# Patient Record
Sex: Female | Born: 1935
Health system: Southern US, Community
[De-identification: ages and names within clinical notes are randomized; demographics above are authoritative.]

## PROBLEM LIST (undated history)

## (undated) DIAGNOSIS — M810 Age-related osteoporosis without current pathological fracture: Secondary | ICD-10-CM

## (undated) DIAGNOSIS — Z8719 Personal history of other diseases of the digestive system: Secondary | ICD-10-CM

## (undated) DIAGNOSIS — I8393 Asymptomatic varicose veins of bilateral lower extremities: Secondary | ICD-10-CM

## (undated) DIAGNOSIS — K602 Anal fissure, unspecified: Secondary | ICD-10-CM

## (undated) DIAGNOSIS — N184 Chronic kidney disease, stage 4 (severe): Secondary | ICD-10-CM

## (undated) DIAGNOSIS — K625 Hemorrhage of anus and rectum: Secondary | ICD-10-CM

## (undated) DIAGNOSIS — K624 Stenosis of anus and rectum: Secondary | ICD-10-CM

## (undated) DIAGNOSIS — K219 Gastro-esophageal reflux disease without esophagitis: Secondary | ICD-10-CM

## (undated) DIAGNOSIS — I10 Essential (primary) hypertension: Secondary | ICD-10-CM

## (undated) DIAGNOSIS — K922 Gastrointestinal hemorrhage, unspecified: Secondary | ICD-10-CM

## (undated) DIAGNOSIS — D62 Acute posthemorrhagic anemia: Secondary | ICD-10-CM

## (undated) DIAGNOSIS — H9190 Unspecified hearing loss, unspecified ear: Secondary | ICD-10-CM

## (undated) DIAGNOSIS — F419 Anxiety disorder, unspecified: Secondary | ICD-10-CM

## (undated) HISTORY — PX: HEMORROIDECTOMY: SUR656

## (undated) HISTORY — PX: TONSILLECTOMY: SUR1361

## (undated) HISTORY — PX: COLONOSCOPY: SHX5424

---

## 1997-10-23 ENCOUNTER — Other Ambulatory Visit: Admission: RE | Admit: 1997-10-23 | Discharge: 1997-10-23 | Payer: Self-pay | Admitting: Obstetrics and Gynecology

## 1998-11-04 ENCOUNTER — Other Ambulatory Visit: Admission: RE | Admit: 1998-11-04 | Discharge: 1998-11-04 | Payer: Self-pay | Admitting: Obstetrics and Gynecology

## 2000-08-27 ENCOUNTER — Encounter: Admission: RE | Admit: 2000-08-27 | Discharge: 2000-08-27 | Payer: Self-pay | Admitting: Orthopedic Surgery

## 2000-08-27 ENCOUNTER — Encounter: Payer: Self-pay | Admitting: Orthopedic Surgery

## 2000-09-10 ENCOUNTER — Encounter: Admission: RE | Admit: 2000-09-10 | Discharge: 2000-09-10 | Payer: Self-pay | Admitting: Orthopedic Surgery

## 2000-09-10 ENCOUNTER — Encounter: Payer: Self-pay | Admitting: Orthopedic Surgery

## 2000-09-24 ENCOUNTER — Encounter: Payer: Self-pay | Admitting: Orthopedic Surgery

## 2000-09-24 ENCOUNTER — Encounter: Admission: RE | Admit: 2000-09-24 | Discharge: 2000-09-24 | Payer: Self-pay | Admitting: Orthopedic Surgery

## 2001-04-11 ENCOUNTER — Other Ambulatory Visit: Admission: RE | Admit: 2001-04-11 | Discharge: 2001-04-11 | Payer: Self-pay | Admitting: Obstetrics and Gynecology

## 2009-04-06 ENCOUNTER — Encounter: Admission: RE | Admit: 2009-04-06 | Discharge: 2009-04-06 | Payer: Self-pay | Admitting: Internal Medicine

## 2010-07-16 IMAGING — US US EXTREM LOW VENOUS BILAT
1 series · 13 of 24 positions shown · non-contrast
Comparison: None.

CLINICAL DATA: Bilateral lower extremity swelling.   Evaluate for
venous insufficiency and varicose veins.

BILATERAL LOWER EXTREMITY VENOUS DUPLEX ULTRASOUND
TECHNIQUE: Gray-scale sonography with graded compression, as well
as color Doppler and duplex ultrasound, were performed to evaluate
the deep and superficial veins of both lower extremities.  Spectral
Doppler was utilized to evaluate flow at rest and with distal
augmentation maneuvers.  A complete superficial venous
insufficiency exam was performed in the upright standing position.
I personally performed the technical portion of the exam.

[Series 1: us extrem low venous bilat · 13 of 54 slices shown]
[im 1/54]
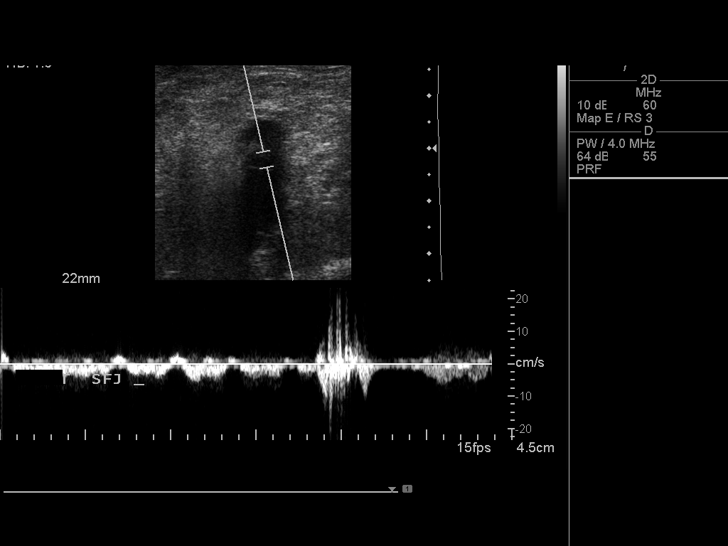
[im 5/54]
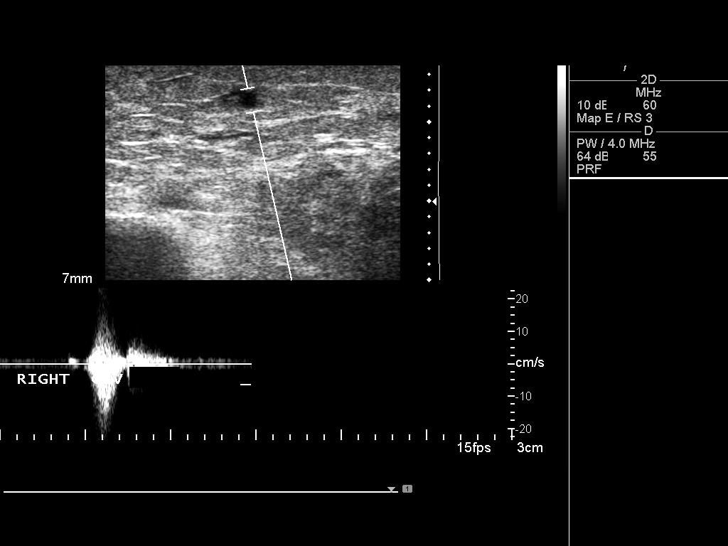
[im 10/54]
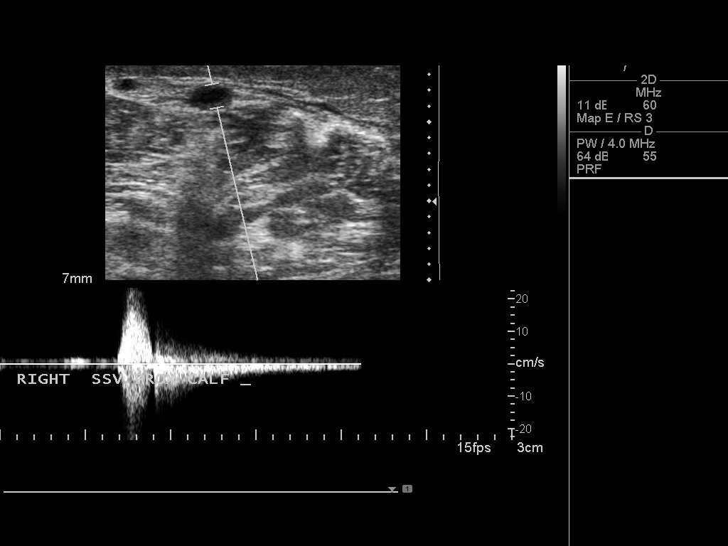
[im 14/54]
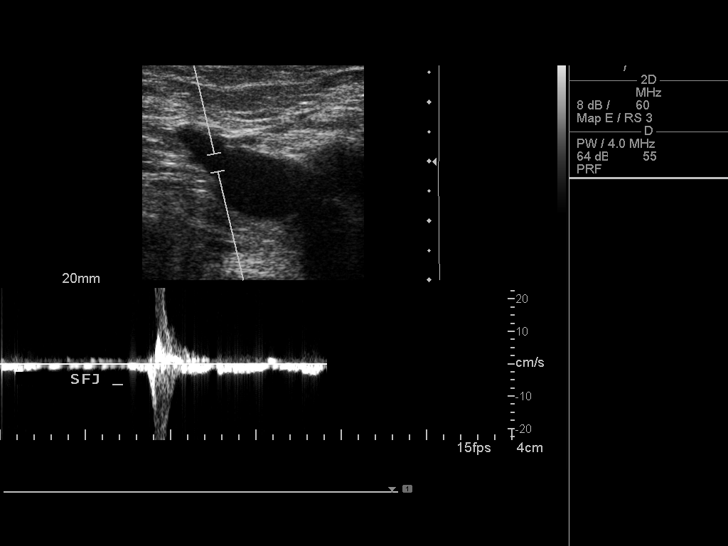
[im 19/54]
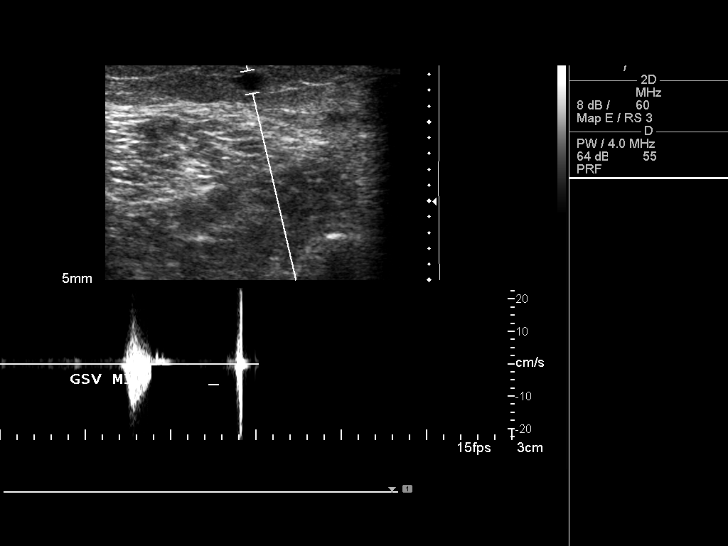
[im 24/54]
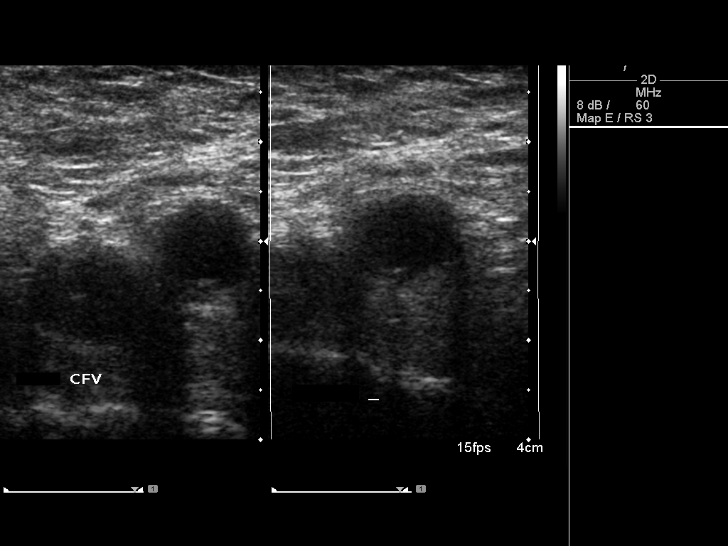
[im 28/54]
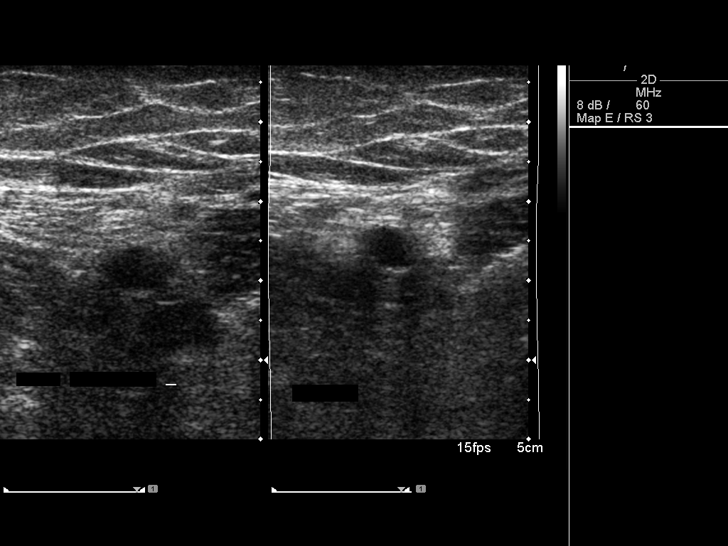
[im 30/54]
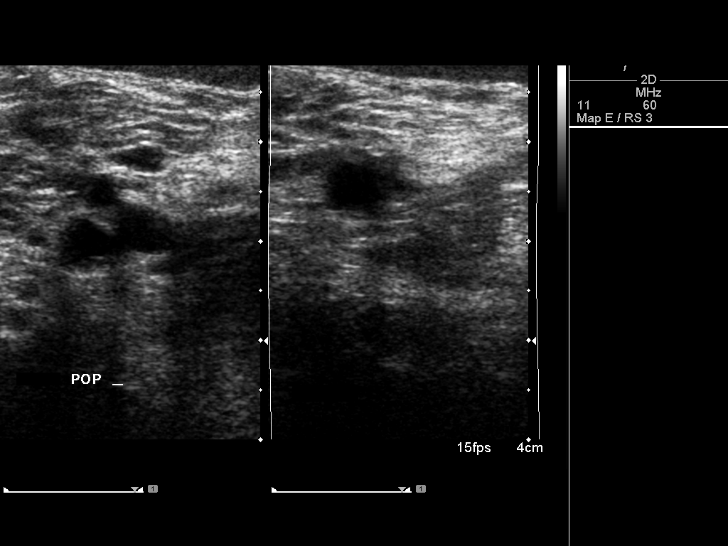
[im 35/54]
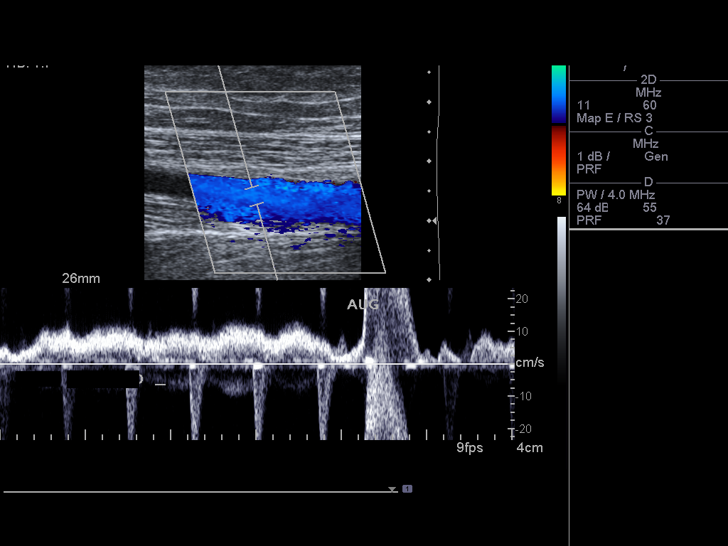
[im 40/54]
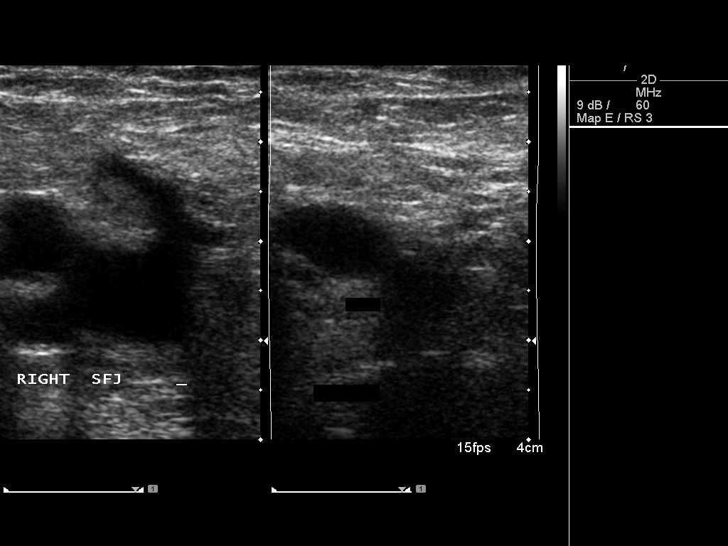
[im 44/54]
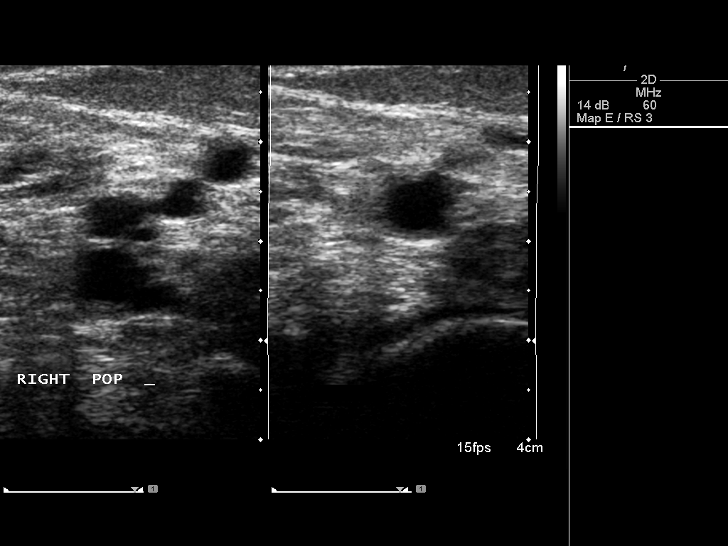
[im 49/54]
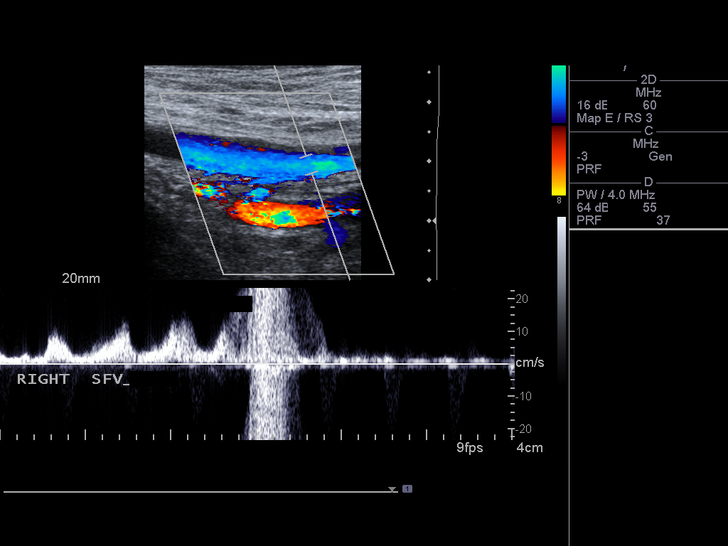
[im 54/54]
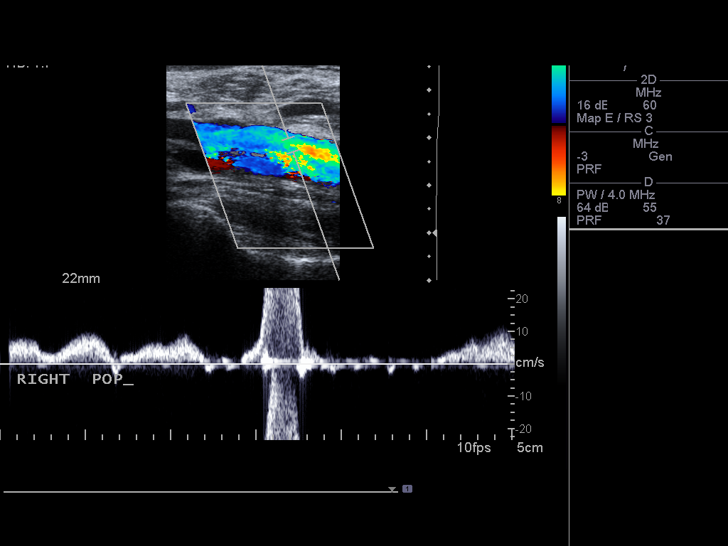

[13 of 24 positions shown; findings below may reference images not displayed]

FINDINGS: Right Lower Extremity:  The right great saphenous vein is small
without evidence of reflux.  A prominent vein coming off the deep
venous system in the right calf does not demonstrate significant
reflux.  The right short saphenous vein comes off the popliteal
vein just above the knee.  This vessel is slightly enlarged and
shows evidence for venous sufficiency and reflux.  There are
probable calcifications involving the distal aspect of the right
short saphenous vein.  Negative for a right lower extremity DVT.
There is subcutaneous edema in the right lower leg.

Left Lower Extremity:  The left great saphenous vein is small
without any significant reflux.  The left short saphenous vein is
small without significant reflux.
IMPRESSION: Negative for bilateral DVT.

Evidence for venous insufficiency and reflux involving the right
short saphenous vein.

No significant reflux involving great saphenous veins.

## 2011-05-12 DIAGNOSIS — J019 Acute sinusitis, unspecified: Secondary | ICD-10-CM | POA: Diagnosis not present

## 2011-09-12 DIAGNOSIS — F411 Generalized anxiety disorder: Secondary | ICD-10-CM | POA: Diagnosis not present

## 2011-09-12 DIAGNOSIS — R5383 Other fatigue: Secondary | ICD-10-CM | POA: Diagnosis not present

## 2011-09-12 DIAGNOSIS — IMO0002 Reserved for concepts with insufficient information to code with codable children: Secondary | ICD-10-CM | POA: Diagnosis not present

## 2011-09-12 DIAGNOSIS — R5381 Other malaise: Secondary | ICD-10-CM | POA: Diagnosis not present

## 2011-09-12 DIAGNOSIS — I10 Essential (primary) hypertension: Secondary | ICD-10-CM | POA: Diagnosis not present

## 2011-10-02 DIAGNOSIS — B309 Viral conjunctivitis, unspecified: Secondary | ICD-10-CM | POA: Diagnosis not present

## 2011-10-24 DIAGNOSIS — H00029 Hordeolum internum unspecified eye, unspecified eyelid: Secondary | ICD-10-CM | POA: Diagnosis not present

## 2011-10-24 DIAGNOSIS — H1045 Other chronic allergic conjunctivitis: Secondary | ICD-10-CM | POA: Diagnosis not present

## 2011-10-24 DIAGNOSIS — H04129 Dry eye syndrome of unspecified lacrimal gland: Secondary | ICD-10-CM | POA: Diagnosis not present

## 2011-11-16 DIAGNOSIS — L259 Unspecified contact dermatitis, unspecified cause: Secondary | ICD-10-CM | POA: Diagnosis not present

## 2011-11-16 DIAGNOSIS — C44319 Basal cell carcinoma of skin of other parts of face: Secondary | ICD-10-CM | POA: Diagnosis not present

## 2011-11-16 DIAGNOSIS — L738 Other specified follicular disorders: Secondary | ICD-10-CM | POA: Diagnosis not present

## 2011-11-16 DIAGNOSIS — L570 Actinic keratosis: Secondary | ICD-10-CM | POA: Diagnosis not present

## 2011-11-28 DIAGNOSIS — H00029 Hordeolum internum unspecified eye, unspecified eyelid: Secondary | ICD-10-CM | POA: Diagnosis not present

## 2011-11-28 DIAGNOSIS — H04129 Dry eye syndrome of unspecified lacrimal gland: Secondary | ICD-10-CM | POA: Diagnosis not present

## 2011-12-13 DIAGNOSIS — B309 Viral conjunctivitis, unspecified: Secondary | ICD-10-CM | POA: Diagnosis not present

## 2011-12-29 DIAGNOSIS — H9319 Tinnitus, unspecified ear: Secondary | ICD-10-CM | POA: Diagnosis not present

## 2011-12-29 DIAGNOSIS — H9190 Unspecified hearing loss, unspecified ear: Secondary | ICD-10-CM | POA: Diagnosis not present

## 2012-01-09 DIAGNOSIS — IMO0002 Reserved for concepts with insufficient information to code with codable children: Secondary | ICD-10-CM | POA: Diagnosis not present

## 2012-01-09 DIAGNOSIS — I10 Essential (primary) hypertension: Secondary | ICD-10-CM | POA: Diagnosis not present

## 2012-01-09 DIAGNOSIS — Z23 Encounter for immunization: Secondary | ICD-10-CM | POA: Diagnosis not present

## 2012-02-05 DIAGNOSIS — H4310 Vitreous hemorrhage, unspecified eye: Secondary | ICD-10-CM | POA: Diagnosis not present

## 2012-02-15 DIAGNOSIS — H1130 Conjunctival hemorrhage, unspecified eye: Secondary | ICD-10-CM | POA: Diagnosis not present

## 2012-06-19 DIAGNOSIS — I10 Essential (primary) hypertension: Secondary | ICD-10-CM | POA: Diagnosis not present

## 2012-06-19 DIAGNOSIS — R229 Localized swelling, mass and lump, unspecified: Secondary | ICD-10-CM | POA: Diagnosis not present

## 2012-06-21 DIAGNOSIS — R229 Localized swelling, mass and lump, unspecified: Secondary | ICD-10-CM | POA: Diagnosis not present

## 2012-06-21 DIAGNOSIS — Z1231 Encounter for screening mammogram for malignant neoplasm of breast: Secondary | ICD-10-CM | POA: Diagnosis not present

## 2012-10-28 DIAGNOSIS — S058X9A Other injuries of unspecified eye and orbit, initial encounter: Secondary | ICD-10-CM | POA: Diagnosis not present

## 2012-11-21 DIAGNOSIS — I10 Essential (primary) hypertension: Secondary | ICD-10-CM | POA: Diagnosis not present

## 2012-11-21 DIAGNOSIS — N390 Urinary tract infection, site not specified: Secondary | ICD-10-CM | POA: Diagnosis not present

## 2012-11-21 DIAGNOSIS — R141 Gas pain: Secondary | ICD-10-CM | POA: Diagnosis not present

## 2012-12-31 DIAGNOSIS — Z23 Encounter for immunization: Secondary | ICD-10-CM | POA: Diagnosis not present

## 2012-12-31 DIAGNOSIS — IMO0002 Reserved for concepts with insufficient information to code with codable children: Secondary | ICD-10-CM | POA: Diagnosis not present

## 2012-12-31 DIAGNOSIS — I10 Essential (primary) hypertension: Secondary | ICD-10-CM | POA: Diagnosis not present

## 2013-05-01 DIAGNOSIS — IMO0002 Reserved for concepts with insufficient information to code with codable children: Secondary | ICD-10-CM | POA: Diagnosis not present

## 2013-05-01 DIAGNOSIS — S81009A Unspecified open wound, unspecified knee, initial encounter: Secondary | ICD-10-CM | POA: Diagnosis not present

## 2013-05-01 DIAGNOSIS — Z23 Encounter for immunization: Secondary | ICD-10-CM | POA: Diagnosis not present

## 2013-05-14 DIAGNOSIS — IMO0002 Reserved for concepts with insufficient information to code with codable children: Secondary | ICD-10-CM | POA: Diagnosis not present

## 2013-05-14 DIAGNOSIS — L02419 Cutaneous abscess of limb, unspecified: Secondary | ICD-10-CM | POA: Diagnosis not present

## 2013-05-14 DIAGNOSIS — L03119 Cellulitis of unspecified part of limb: Secondary | ICD-10-CM | POA: Diagnosis not present

## 2013-07-08 DIAGNOSIS — M810 Age-related osteoporosis without current pathological fracture: Secondary | ICD-10-CM | POA: Diagnosis not present

## 2013-07-08 DIAGNOSIS — IMO0002 Reserved for concepts with insufficient information to code with codable children: Secondary | ICD-10-CM | POA: Diagnosis not present

## 2013-07-08 DIAGNOSIS — I10 Essential (primary) hypertension: Secondary | ICD-10-CM | POA: Diagnosis not present

## 2013-07-08 DIAGNOSIS — F411 Generalized anxiety disorder: Secondary | ICD-10-CM | POA: Diagnosis not present

## 2013-09-04 DIAGNOSIS — J019 Acute sinusitis, unspecified: Secondary | ICD-10-CM | POA: Diagnosis not present

## 2013-09-04 DIAGNOSIS — IMO0002 Reserved for concepts with insufficient information to code with codable children: Secondary | ICD-10-CM | POA: Diagnosis not present

## 2013-12-22 DIAGNOSIS — J069 Acute upper respiratory infection, unspecified: Secondary | ICD-10-CM | POA: Diagnosis not present

## 2013-12-22 DIAGNOSIS — IMO0002 Reserved for concepts with insufficient information to code with codable children: Secondary | ICD-10-CM | POA: Diagnosis not present

## 2014-01-12 DIAGNOSIS — J329 Chronic sinusitis, unspecified: Secondary | ICD-10-CM | POA: Diagnosis not present

## 2014-01-12 DIAGNOSIS — F419 Anxiety disorder, unspecified: Secondary | ICD-10-CM | POA: Diagnosis not present

## 2014-01-12 DIAGNOSIS — M810 Age-related osteoporosis without current pathological fracture: Secondary | ICD-10-CM | POA: Diagnosis not present

## 2014-01-12 DIAGNOSIS — I10 Essential (primary) hypertension: Secondary | ICD-10-CM | POA: Diagnosis not present

## 2014-01-12 DIAGNOSIS — Z6821 Body mass index (BMI) 21.0-21.9, adult: Secondary | ICD-10-CM | POA: Diagnosis not present

## 2014-01-12 DIAGNOSIS — Z23 Encounter for immunization: Secondary | ICD-10-CM | POA: Diagnosis not present

## 2014-07-16 DIAGNOSIS — Z6821 Body mass index (BMI) 21.0-21.9, adult: Secondary | ICD-10-CM | POA: Diagnosis not present

## 2014-07-16 DIAGNOSIS — Z9181 History of falling: Secondary | ICD-10-CM | POA: Diagnosis not present

## 2014-07-16 DIAGNOSIS — F419 Anxiety disorder, unspecified: Secondary | ICD-10-CM | POA: Diagnosis not present

## 2014-07-16 DIAGNOSIS — Z1389 Encounter for screening for other disorder: Secondary | ICD-10-CM | POA: Diagnosis not present

## 2014-07-16 DIAGNOSIS — I10 Essential (primary) hypertension: Secondary | ICD-10-CM | POA: Diagnosis not present

## 2014-07-16 DIAGNOSIS — M810 Age-related osteoporosis without current pathological fracture: Secondary | ICD-10-CM | POA: Diagnosis not present

## 2014-09-18 DIAGNOSIS — B370 Candidal stomatitis: Secondary | ICD-10-CM | POA: Diagnosis not present

## 2014-09-18 DIAGNOSIS — Z6821 Body mass index (BMI) 21.0-21.9, adult: Secondary | ICD-10-CM | POA: Diagnosis not present

## 2014-11-26 DIAGNOSIS — I10 Essential (primary) hypertension: Secondary | ICD-10-CM | POA: Diagnosis not present

## 2014-11-26 DIAGNOSIS — Z23 Encounter for immunization: Secondary | ICD-10-CM | POA: Diagnosis not present

## 2014-11-26 DIAGNOSIS — Z6821 Body mass index (BMI) 21.0-21.9, adult: Secondary | ICD-10-CM | POA: Diagnosis not present

## 2014-12-28 DIAGNOSIS — J019 Acute sinusitis, unspecified: Secondary | ICD-10-CM | POA: Diagnosis not present

## 2014-12-28 DIAGNOSIS — Z6821 Body mass index (BMI) 21.0-21.9, adult: Secondary | ICD-10-CM | POA: Diagnosis not present

## 2015-01-04 DIAGNOSIS — M533 Sacrococcygeal disorders, not elsewhere classified: Secondary | ICD-10-CM | POA: Diagnosis not present

## 2015-01-04 DIAGNOSIS — R918 Other nonspecific abnormal finding of lung field: Secondary | ICD-10-CM | POA: Diagnosis present

## 2015-01-04 DIAGNOSIS — S82832A Other fracture of upper and lower end of left fibula, initial encounter for closed fracture: Secondary | ICD-10-CM | POA: Diagnosis present

## 2015-01-04 DIAGNOSIS — M25572 Pain in left ankle and joints of left foot: Secondary | ICD-10-CM | POA: Diagnosis not present

## 2015-01-04 DIAGNOSIS — M549 Dorsalgia, unspecified: Secondary | ICD-10-CM | POA: Diagnosis not present

## 2015-01-04 DIAGNOSIS — S3992XA Unspecified injury of lower back, initial encounter: Secondary | ICD-10-CM | POA: Diagnosis not present

## 2015-01-04 DIAGNOSIS — Z7189 Other specified counseling: Secondary | ICD-10-CM | POA: Diagnosis not present

## 2015-01-04 DIAGNOSIS — S299XXA Unspecified injury of thorax, initial encounter: Secondary | ICD-10-CM | POA: Diagnosis not present

## 2015-01-04 DIAGNOSIS — J90 Pleural effusion, not elsewhere classified: Secondary | ICD-10-CM | POA: Diagnosis not present

## 2015-01-04 DIAGNOSIS — S22000A Wedge compression fracture of unspecified thoracic vertebra, initial encounter for closed fracture: Secondary | ICD-10-CM | POA: Diagnosis not present

## 2015-01-04 DIAGNOSIS — S89292A Other physeal fracture of upper end of left fibula, initial encounter for closed fracture: Secondary | ICD-10-CM | POA: Diagnosis not present

## 2015-01-04 DIAGNOSIS — S32132A Severely displaced Zone III fracture of sacrum, initial encounter for closed fracture: Secondary | ICD-10-CM | POA: Diagnosis not present

## 2015-01-04 DIAGNOSIS — S82432A Displaced oblique fracture of shaft of left fibula, initial encounter for closed fracture: Secondary | ICD-10-CM | POA: Diagnosis not present

## 2015-01-04 DIAGNOSIS — S82402D Unspecified fracture of shaft of left fibula, subsequent encounter for closed fracture with routine healing: Secondary | ICD-10-CM | POA: Diagnosis not present

## 2015-01-04 DIAGNOSIS — S32059A Unspecified fracture of fifth lumbar vertebra, initial encounter for closed fracture: Secondary | ICD-10-CM | POA: Diagnosis not present

## 2015-01-04 DIAGNOSIS — S22080A Wedge compression fracture of T11-T12 vertebra, initial encounter for closed fracture: Secondary | ICD-10-CM | POA: Diagnosis not present

## 2015-01-04 DIAGNOSIS — R54 Age-related physical debility: Secondary | ICD-10-CM | POA: Diagnosis not present

## 2015-01-04 DIAGNOSIS — S82392A Other fracture of lower end of left tibia, initial encounter for closed fracture: Secondary | ICD-10-CM | POA: Diagnosis not present

## 2015-01-04 DIAGNOSIS — S82402A Unspecified fracture of shaft of left fibula, initial encounter for closed fracture: Secondary | ICD-10-CM | POA: Diagnosis not present

## 2015-01-04 DIAGNOSIS — S3210XA Unspecified fracture of sacrum, initial encounter for closed fracture: Secondary | ICD-10-CM | POA: Diagnosis present

## 2015-01-04 DIAGNOSIS — M25462 Effusion, left knee: Secondary | ICD-10-CM | POA: Diagnosis present

## 2015-01-04 DIAGNOSIS — R410 Disorientation, unspecified: Secondary | ICD-10-CM | POA: Diagnosis not present

## 2015-01-04 DIAGNOSIS — R000 Tachycardia, unspecified: Secondary | ICD-10-CM | POA: Diagnosis not present

## 2015-01-04 DIAGNOSIS — R278 Other lack of coordination: Secondary | ICD-10-CM | POA: Diagnosis not present

## 2015-01-04 DIAGNOSIS — S3210XD Unspecified fracture of sacrum, subsequent encounter for fracture with routine healing: Secondary | ICD-10-CM | POA: Diagnosis not present

## 2015-01-04 DIAGNOSIS — K449 Diaphragmatic hernia without obstruction or gangrene: Secondary | ICD-10-CM | POA: Diagnosis not present

## 2015-01-04 DIAGNOSIS — K219 Gastro-esophageal reflux disease without esophagitis: Secondary | ICD-10-CM | POA: Diagnosis present

## 2015-01-04 DIAGNOSIS — S22089D Unspecified fracture of T11-T12 vertebra, subsequent encounter for fracture with routine healing: Secondary | ICD-10-CM | POA: Diagnosis not present

## 2015-01-04 DIAGNOSIS — D62 Acute posthemorrhagic anemia: Secondary | ICD-10-CM | POA: Diagnosis present

## 2015-01-04 DIAGNOSIS — G4700 Insomnia, unspecified: Secondary | ICD-10-CM | POA: Diagnosis not present

## 2015-01-04 DIAGNOSIS — S82202A Unspecified fracture of shaft of left tibia, initial encounter for closed fracture: Secondary | ICD-10-CM | POA: Diagnosis not present

## 2015-01-04 DIAGNOSIS — S32050A Wedge compression fracture of fifth lumbar vertebra, initial encounter for closed fracture: Secondary | ICD-10-CM | POA: Diagnosis not present

## 2015-01-04 DIAGNOSIS — R1319 Other dysphagia: Secondary | ICD-10-CM | POA: Diagnosis not present

## 2015-01-04 DIAGNOSIS — K5900 Constipation, unspecified: Secondary | ICD-10-CM | POA: Diagnosis not present

## 2015-01-04 DIAGNOSIS — E119 Type 2 diabetes mellitus without complications: Secondary | ICD-10-CM | POA: Diagnosis present

## 2015-01-04 DIAGNOSIS — R911 Solitary pulmonary nodule: Secondary | ICD-10-CM | POA: Diagnosis not present

## 2015-01-04 DIAGNOSIS — J984 Other disorders of lung: Secondary | ICD-10-CM | POA: Diagnosis not present

## 2015-01-04 DIAGNOSIS — D649 Anemia, unspecified: Secondary | ICD-10-CM | POA: Diagnosis not present

## 2015-01-04 DIAGNOSIS — S0990XA Unspecified injury of head, initial encounter: Secondary | ICD-10-CM | POA: Diagnosis not present

## 2015-01-04 DIAGNOSIS — Z7409 Other reduced mobility: Secondary | ICD-10-CM | POA: Diagnosis not present

## 2015-01-04 DIAGNOSIS — M25562 Pain in left knee: Secondary | ICD-10-CM | POA: Diagnosis not present

## 2015-01-04 DIAGNOSIS — I10 Essential (primary) hypertension: Secondary | ICD-10-CM | POA: Diagnosis present

## 2015-01-04 DIAGNOSIS — Z79899 Other long term (current) drug therapy: Secondary | ICD-10-CM | POA: Diagnosis not present

## 2015-01-04 DIAGNOSIS — S82202D Unspecified fracture of shaft of left tibia, subsequent encounter for closed fracture with routine healing: Secondary | ICD-10-CM | POA: Diagnosis not present

## 2015-01-04 DIAGNOSIS — M4848XA Fatigue fracture of vertebra, sacral and sacrococcygeal region, initial encounter for fracture: Secondary | ICD-10-CM | POA: Diagnosis not present

## 2015-01-04 DIAGNOSIS — T148 Other injury of unspecified body region: Secondary | ICD-10-CM | POA: Diagnosis not present

## 2015-01-04 DIAGNOSIS — T07 Unspecified multiple injuries: Secondary | ICD-10-CM | POA: Diagnosis not present

## 2015-01-04 DIAGNOSIS — M6281 Muscle weakness (generalized): Secondary | ICD-10-CM | POA: Diagnosis not present

## 2015-01-04 DIAGNOSIS — R0689 Other abnormalities of breathing: Secondary | ICD-10-CM | POA: Diagnosis present

## 2015-01-04 DIAGNOSIS — S22088A Other fracture of T11-T12 vertebra, initial encounter for closed fracture: Secondary | ICD-10-CM | POA: Diagnosis not present

## 2015-01-04 DIAGNOSIS — S32000A Wedge compression fracture of unspecified lumbar vertebra, initial encounter for closed fracture: Secondary | ICD-10-CM | POA: Diagnosis not present

## 2015-01-04 DIAGNOSIS — M1990 Unspecified osteoarthritis, unspecified site: Secondary | ICD-10-CM | POA: Diagnosis present

## 2015-01-04 DIAGNOSIS — S199XXA Unspecified injury of neck, initial encounter: Secondary | ICD-10-CM | POA: Diagnosis not present

## 2015-01-04 DIAGNOSIS — S32059D Unspecified fracture of fifth lumbar vertebra, subsequent encounter for fracture with routine healing: Secondary | ICD-10-CM | POA: Diagnosis not present

## 2015-01-04 DIAGNOSIS — T1490 Injury, unspecified: Secondary | ICD-10-CM | POA: Diagnosis not present

## 2015-01-04 DIAGNOSIS — S82142A Displaced bicondylar fracture of left tibia, initial encounter for closed fracture: Secondary | ICD-10-CM | POA: Diagnosis not present

## 2015-01-04 DIAGNOSIS — S89092A Other physeal fracture of upper end of left tibia, initial encounter for closed fracture: Secondary | ICD-10-CM | POA: Diagnosis not present

## 2015-01-04 DIAGNOSIS — S32058A Other fracture of fifth lumbar vertebra, initial encounter for closed fracture: Secondary | ICD-10-CM | POA: Diagnosis not present

## 2015-01-04 DIAGNOSIS — S82252A Displaced comminuted fracture of shaft of left tibia, initial encounter for closed fracture: Secondary | ICD-10-CM | POA: Diagnosis not present

## 2015-01-04 DIAGNOSIS — S22089S Unspecified fracture of T11-T12 vertebra, sequela: Secondary | ICD-10-CM | POA: Diagnosis not present

## 2015-01-04 DIAGNOSIS — J9811 Atelectasis: Secondary | ICD-10-CM | POA: Diagnosis not present

## 2015-01-04 DIAGNOSIS — S22081A Stable burst fracture of T11-T12 vertebra, initial encounter for closed fracture: Secondary | ICD-10-CM | POA: Diagnosis present

## 2015-01-04 DIAGNOSIS — S3219XA Other fracture of sacrum, initial encounter for closed fracture: Secondary | ICD-10-CM | POA: Diagnosis not present

## 2015-01-04 DIAGNOSIS — Z515 Encounter for palliative care: Secondary | ICD-10-CM | POA: Diagnosis present

## 2015-01-11 DIAGNOSIS — M439 Deforming dorsopathy, unspecified: Secondary | ICD-10-CM | POA: Diagnosis not present

## 2015-01-11 DIAGNOSIS — S82832D Other fracture of upper and lower end of left fibula, subsequent encounter for closed fracture with routine healing: Secondary | ICD-10-CM | POA: Diagnosis not present

## 2015-01-11 DIAGNOSIS — R918 Other nonspecific abnormal finding of lung field: Secondary | ICD-10-CM | POA: Diagnosis not present

## 2015-01-11 DIAGNOSIS — S32058A Other fracture of fifth lumbar vertebra, initial encounter for closed fracture: Secondary | ICD-10-CM | POA: Diagnosis not present

## 2015-01-11 DIAGNOSIS — M25462 Effusion, left knee: Secondary | ICD-10-CM | POA: Diagnosis not present

## 2015-01-11 DIAGNOSIS — S329XXA Fracture of unspecified parts of lumbosacral spine and pelvis, initial encounter for closed fracture: Secondary | ICD-10-CM | POA: Diagnosis not present

## 2015-01-11 DIAGNOSIS — Z7982 Long term (current) use of aspirin: Secondary | ICD-10-CM | POA: Diagnosis not present

## 2015-01-11 DIAGNOSIS — M47814 Spondylosis without myelopathy or radiculopathy, thoracic region: Secondary | ICD-10-CM | POA: Diagnosis not present

## 2015-01-11 DIAGNOSIS — R911 Solitary pulmonary nodule: Secondary | ICD-10-CM | POA: Diagnosis not present

## 2015-01-11 DIAGNOSIS — G4700 Insomnia, unspecified: Secondary | ICD-10-CM | POA: Diagnosis not present

## 2015-01-11 DIAGNOSIS — M6281 Muscle weakness (generalized): Secondary | ICD-10-CM | POA: Diagnosis not present

## 2015-01-11 DIAGNOSIS — S22080D Wedge compression fracture of T11-T12 vertebra, subsequent encounter for fracture with routine healing: Secondary | ICD-10-CM | POA: Diagnosis not present

## 2015-01-11 DIAGNOSIS — S82002K Unspecified fracture of left patella, subsequent encounter for closed fracture with nonunion: Secondary | ICD-10-CM | POA: Diagnosis not present

## 2015-01-11 DIAGNOSIS — J90 Pleural effusion, not elsewhere classified: Secondary | ICD-10-CM | POA: Diagnosis not present

## 2015-01-11 DIAGNOSIS — T148 Other injury of unspecified body region: Secondary | ICD-10-CM | POA: Diagnosis not present

## 2015-01-11 DIAGNOSIS — S82142D Displaced bicondylar fracture of left tibia, subsequent encounter for closed fracture with routine healing: Secondary | ICD-10-CM | POA: Diagnosis not present

## 2015-01-11 DIAGNOSIS — S22088D Other fracture of T11-T12 vertebra, subsequent encounter for fracture with routine healing: Secondary | ICD-10-CM | POA: Diagnosis not present

## 2015-01-11 DIAGNOSIS — S22088A Other fracture of T11-T12 vertebra, initial encounter for closed fracture: Secondary | ICD-10-CM | POA: Diagnosis not present

## 2015-01-11 DIAGNOSIS — S82142A Displaced bicondylar fracture of left tibia, initial encounter for closed fracture: Secondary | ICD-10-CM | POA: Diagnosis not present

## 2015-01-11 DIAGNOSIS — M8580 Other specified disorders of bone density and structure, unspecified site: Secondary | ICD-10-CM | POA: Diagnosis not present

## 2015-01-11 DIAGNOSIS — Z7409 Other reduced mobility: Secondary | ICD-10-CM | POA: Diagnosis not present

## 2015-01-11 DIAGNOSIS — M1990 Unspecified osteoarthritis, unspecified site: Secondary | ICD-10-CM | POA: Diagnosis not present

## 2015-01-11 DIAGNOSIS — M25579 Pain in unspecified ankle and joints of unspecified foot: Secondary | ICD-10-CM | POA: Diagnosis not present

## 2015-01-11 DIAGNOSIS — S22089D Unspecified fracture of T11-T12 vertebra, subsequent encounter for fracture with routine healing: Secondary | ICD-10-CM | POA: Diagnosis not present

## 2015-01-11 DIAGNOSIS — K219 Gastro-esophageal reflux disease without esophagitis: Secondary | ICD-10-CM | POA: Diagnosis not present

## 2015-01-11 DIAGNOSIS — S32009K Unspecified fracture of unspecified lumbar vertebra, subsequent encounter for fracture with nonunion: Secondary | ICD-10-CM | POA: Diagnosis not present

## 2015-01-11 DIAGNOSIS — H10019 Acute follicular conjunctivitis, unspecified eye: Secondary | ICD-10-CM | POA: Diagnosis not present

## 2015-01-11 DIAGNOSIS — Z4789 Encounter for other orthopedic aftercare: Secondary | ICD-10-CM | POA: Diagnosis not present

## 2015-01-11 DIAGNOSIS — S82432A Displaced oblique fracture of shaft of left fibula, initial encounter for closed fracture: Secondary | ICD-10-CM | POA: Diagnosis not present

## 2015-01-11 DIAGNOSIS — S3210XD Unspecified fracture of sacrum, subsequent encounter for fracture with routine healing: Secondary | ICD-10-CM | POA: Diagnosis not present

## 2015-01-11 DIAGNOSIS — I10 Essential (primary) hypertension: Secondary | ICD-10-CM | POA: Diagnosis not present

## 2015-01-11 DIAGNOSIS — M25562 Pain in left knee: Secondary | ICD-10-CM | POA: Diagnosis not present

## 2015-01-11 DIAGNOSIS — S82832A Other fracture of upper and lower end of left fibula, initial encounter for closed fracture: Secondary | ICD-10-CM | POA: Diagnosis not present

## 2015-01-11 DIAGNOSIS — R1319 Other dysphagia: Secondary | ICD-10-CM | POA: Diagnosis not present

## 2015-01-11 DIAGNOSIS — S22058D Other fracture of T5-T6 vertebra, subsequent encounter for fracture with routine healing: Secondary | ICD-10-CM | POA: Diagnosis not present

## 2015-01-11 DIAGNOSIS — R410 Disorientation, unspecified: Secondary | ICD-10-CM | POA: Diagnosis not present

## 2015-01-11 DIAGNOSIS — K5900 Constipation, unspecified: Secondary | ICD-10-CM | POA: Diagnosis not present

## 2015-01-11 DIAGNOSIS — S82202D Unspecified fracture of shaft of left tibia, subsequent encounter for closed fracture with routine healing: Secondary | ICD-10-CM | POA: Diagnosis not present

## 2015-01-11 DIAGNOSIS — M21262 Flexion deformity, left knee: Secondary | ICD-10-CM | POA: Diagnosis not present

## 2015-01-11 DIAGNOSIS — Z79899 Other long term (current) drug therapy: Secondary | ICD-10-CM | POA: Diagnosis not present

## 2015-01-11 DIAGNOSIS — S22050D Wedge compression fracture of T5-T6 vertebra, subsequent encounter for fracture with routine healing: Secondary | ICD-10-CM | POA: Diagnosis not present

## 2015-01-11 DIAGNOSIS — Z23 Encounter for immunization: Secondary | ICD-10-CM | POA: Diagnosis not present

## 2015-01-11 DIAGNOSIS — S82252A Displaced comminuted fracture of shaft of left tibia, initial encounter for closed fracture: Secondary | ICD-10-CM | POA: Diagnosis not present

## 2015-01-11 DIAGNOSIS — D649 Anemia, unspecified: Secondary | ICD-10-CM | POA: Diagnosis not present

## 2015-01-11 DIAGNOSIS — S82402D Unspecified fracture of shaft of left fibula, subsequent encounter for closed fracture with routine healing: Secondary | ICD-10-CM | POA: Diagnosis not present

## 2015-01-11 DIAGNOSIS — I7090 Unspecified atherosclerosis: Secondary | ICD-10-CM | POA: Diagnosis not present

## 2015-01-11 DIAGNOSIS — S32059D Unspecified fracture of fifth lumbar vertebra, subsequent encounter for fracture with routine healing: Secondary | ICD-10-CM | POA: Diagnosis not present

## 2015-01-11 DIAGNOSIS — R278 Other lack of coordination: Secondary | ICD-10-CM | POA: Diagnosis not present

## 2015-01-13 DIAGNOSIS — S32009K Unspecified fracture of unspecified lumbar vertebra, subsequent encounter for fracture with nonunion: Secondary | ICD-10-CM | POA: Diagnosis not present

## 2015-01-13 DIAGNOSIS — R911 Solitary pulmonary nodule: Secondary | ICD-10-CM | POA: Diagnosis not present

## 2015-01-13 DIAGNOSIS — K219 Gastro-esophageal reflux disease without esophagitis: Secondary | ICD-10-CM | POA: Diagnosis not present

## 2015-01-13 DIAGNOSIS — D649 Anemia, unspecified: Secondary | ICD-10-CM | POA: Diagnosis not present

## 2015-01-13 DIAGNOSIS — S82002K Unspecified fracture of left patella, subsequent encounter for closed fracture with nonunion: Secondary | ICD-10-CM | POA: Diagnosis not present

## 2015-01-13 DIAGNOSIS — I10 Essential (primary) hypertension: Secondary | ICD-10-CM | POA: Diagnosis not present

## 2015-01-14 DIAGNOSIS — Z23 Encounter for immunization: Secondary | ICD-10-CM | POA: Diagnosis not present

## 2015-01-15 DIAGNOSIS — I10 Essential (primary) hypertension: Secondary | ICD-10-CM | POA: Diagnosis not present

## 2015-01-15 DIAGNOSIS — H10019 Acute follicular conjunctivitis, unspecified eye: Secondary | ICD-10-CM | POA: Diagnosis not present

## 2015-01-15 DIAGNOSIS — S82002K Unspecified fracture of left patella, subsequent encounter for closed fracture with nonunion: Secondary | ICD-10-CM | POA: Diagnosis not present

## 2015-01-15 DIAGNOSIS — K219 Gastro-esophageal reflux disease without esophagitis: Secondary | ICD-10-CM | POA: Diagnosis not present

## 2015-01-15 DIAGNOSIS — D649 Anemia, unspecified: Secondary | ICD-10-CM | POA: Diagnosis not present

## 2015-01-15 DIAGNOSIS — S32009K Unspecified fracture of unspecified lumbar vertebra, subsequent encounter for fracture with nonunion: Secondary | ICD-10-CM | POA: Diagnosis not present

## 2015-01-15 DIAGNOSIS — R911 Solitary pulmonary nodule: Secondary | ICD-10-CM | POA: Diagnosis not present

## 2015-01-26 DIAGNOSIS — S82402D Unspecified fracture of shaft of left fibula, subsequent encounter for closed fracture with routine healing: Secondary | ICD-10-CM | POA: Diagnosis not present

## 2015-01-26 DIAGNOSIS — S82202D Unspecified fracture of shaft of left tibia, subsequent encounter for closed fracture with routine healing: Secondary | ICD-10-CM | POA: Diagnosis not present

## 2015-01-26 DIAGNOSIS — S82142A Displaced bicondylar fracture of left tibia, initial encounter for closed fracture: Secondary | ICD-10-CM | POA: Diagnosis not present

## 2015-01-26 DIAGNOSIS — S82832A Other fracture of upper and lower end of left fibula, initial encounter for closed fracture: Secondary | ICD-10-CM | POA: Diagnosis not present

## 2015-02-03 DIAGNOSIS — S22050D Wedge compression fracture of T5-T6 vertebra, subsequent encounter for fracture with routine healing: Secondary | ICD-10-CM | POA: Diagnosis not present

## 2015-02-03 DIAGNOSIS — M8580 Other specified disorders of bone density and structure, unspecified site: Secondary | ICD-10-CM | POA: Diagnosis not present

## 2015-02-03 DIAGNOSIS — I10 Essential (primary) hypertension: Secondary | ICD-10-CM | POA: Diagnosis not present

## 2015-02-03 DIAGNOSIS — S22080D Wedge compression fracture of T11-T12 vertebra, subsequent encounter for fracture with routine healing: Secondary | ICD-10-CM | POA: Diagnosis not present

## 2015-02-03 DIAGNOSIS — M47814 Spondylosis without myelopathy or radiculopathy, thoracic region: Secondary | ICD-10-CM | POA: Diagnosis not present

## 2015-02-03 DIAGNOSIS — M439 Deforming dorsopathy, unspecified: Secondary | ICD-10-CM | POA: Diagnosis not present

## 2015-02-10 DIAGNOSIS — S82002K Unspecified fracture of left patella, subsequent encounter for closed fracture with nonunion: Secondary | ICD-10-CM | POA: Diagnosis not present

## 2015-02-10 DIAGNOSIS — K5900 Constipation, unspecified: Secondary | ICD-10-CM | POA: Diagnosis not present

## 2015-02-10 DIAGNOSIS — I10 Essential (primary) hypertension: Secondary | ICD-10-CM | POA: Diagnosis not present

## 2015-02-10 DIAGNOSIS — D649 Anemia, unspecified: Secondary | ICD-10-CM | POA: Diagnosis not present

## 2015-02-10 DIAGNOSIS — S32009K Unspecified fracture of unspecified lumbar vertebra, subsequent encounter for fracture with nonunion: Secondary | ICD-10-CM | POA: Diagnosis not present

## 2015-02-10 DIAGNOSIS — R911 Solitary pulmonary nodule: Secondary | ICD-10-CM | POA: Diagnosis not present

## 2015-02-10 DIAGNOSIS — K219 Gastro-esophageal reflux disease without esophagitis: Secondary | ICD-10-CM | POA: Diagnosis not present

## 2015-02-23 DIAGNOSIS — S82402D Unspecified fracture of shaft of left fibula, subsequent encounter for closed fracture with routine healing: Secondary | ICD-10-CM | POA: Diagnosis not present

## 2015-02-23 DIAGNOSIS — S82202D Unspecified fracture of shaft of left tibia, subsequent encounter for closed fracture with routine healing: Secondary | ICD-10-CM | POA: Diagnosis not present

## 2015-02-23 DIAGNOSIS — Z7982 Long term (current) use of aspirin: Secondary | ICD-10-CM | POA: Diagnosis not present

## 2015-02-23 DIAGNOSIS — Z4789 Encounter for other orthopedic aftercare: Secondary | ICD-10-CM | POA: Diagnosis not present

## 2015-03-12 DIAGNOSIS — D649 Anemia, unspecified: Secondary | ICD-10-CM | POA: Diagnosis not present

## 2015-03-12 DIAGNOSIS — I10 Essential (primary) hypertension: Secondary | ICD-10-CM | POA: Diagnosis not present

## 2015-03-12 DIAGNOSIS — K219 Gastro-esophageal reflux disease without esophagitis: Secondary | ICD-10-CM | POA: Diagnosis not present

## 2015-03-12 DIAGNOSIS — R911 Solitary pulmonary nodule: Secondary | ICD-10-CM | POA: Diagnosis not present

## 2015-03-17 DIAGNOSIS — S22058D Other fracture of T5-T6 vertebra, subsequent encounter for fracture with routine healing: Secondary | ICD-10-CM | POA: Diagnosis not present

## 2015-03-17 DIAGNOSIS — I10 Essential (primary) hypertension: Secondary | ICD-10-CM | POA: Diagnosis not present

## 2015-03-17 DIAGNOSIS — S22088D Other fracture of T11-T12 vertebra, subsequent encounter for fracture with routine healing: Secondary | ICD-10-CM | POA: Diagnosis not present

## 2015-03-17 DIAGNOSIS — S22089D Unspecified fracture of T11-T12 vertebra, subsequent encounter for fracture with routine healing: Secondary | ICD-10-CM | POA: Diagnosis not present

## 2015-03-17 DIAGNOSIS — Z7982 Long term (current) use of aspirin: Secondary | ICD-10-CM | POA: Diagnosis not present

## 2015-03-17 DIAGNOSIS — Z79899 Other long term (current) drug therapy: Secondary | ICD-10-CM | POA: Diagnosis not present

## 2015-03-24 DIAGNOSIS — I10 Essential (primary) hypertension: Secondary | ICD-10-CM | POA: Diagnosis not present

## 2015-03-24 DIAGNOSIS — K219 Gastro-esophageal reflux disease without esophagitis: Secondary | ICD-10-CM | POA: Diagnosis not present

## 2015-03-24 DIAGNOSIS — D649 Anemia, unspecified: Secondary | ICD-10-CM | POA: Diagnosis not present

## 2015-03-24 DIAGNOSIS — G4700 Insomnia, unspecified: Secondary | ICD-10-CM | POA: Diagnosis not present

## 2015-03-24 DIAGNOSIS — R911 Solitary pulmonary nodule: Secondary | ICD-10-CM | POA: Diagnosis not present

## 2015-04-02 DIAGNOSIS — G4700 Insomnia, unspecified: Secondary | ICD-10-CM | POA: Diagnosis not present

## 2015-04-02 DIAGNOSIS — D649 Anemia, unspecified: Secondary | ICD-10-CM | POA: Diagnosis not present

## 2015-04-02 DIAGNOSIS — I10 Essential (primary) hypertension: Secondary | ICD-10-CM | POA: Diagnosis not present

## 2015-04-02 DIAGNOSIS — R911 Solitary pulmonary nodule: Secondary | ICD-10-CM | POA: Diagnosis not present

## 2015-04-02 DIAGNOSIS — K219 Gastro-esophageal reflux disease without esophagitis: Secondary | ICD-10-CM | POA: Diagnosis not present

## 2015-04-06 DIAGNOSIS — S82142D Displaced bicondylar fracture of left tibia, subsequent encounter for closed fracture with routine healing: Secondary | ICD-10-CM | POA: Diagnosis not present

## 2015-04-06 DIAGNOSIS — S82402D Unspecified fracture of shaft of left fibula, subsequent encounter for closed fracture with routine healing: Secondary | ICD-10-CM | POA: Diagnosis not present

## 2015-04-06 DIAGNOSIS — S82832D Other fracture of upper and lower end of left fibula, subsequent encounter for closed fracture with routine healing: Secondary | ICD-10-CM | POA: Diagnosis not present

## 2015-04-06 DIAGNOSIS — M8580 Other specified disorders of bone density and structure, unspecified site: Secondary | ICD-10-CM | POA: Diagnosis not present

## 2015-04-06 DIAGNOSIS — S82202D Unspecified fracture of shaft of left tibia, subsequent encounter for closed fracture with routine healing: Secondary | ICD-10-CM | POA: Diagnosis not present

## 2015-04-08 DIAGNOSIS — Z602 Problems related to living alone: Secondary | ICD-10-CM | POA: Diagnosis not present

## 2015-04-08 DIAGNOSIS — S32059D Unspecified fracture of fifth lumbar vertebra, subsequent encounter for fracture with routine healing: Secondary | ICD-10-CM | POA: Diagnosis not present

## 2015-04-08 DIAGNOSIS — S82202D Unspecified fracture of shaft of left tibia, subsequent encounter for closed fracture with routine healing: Secondary | ICD-10-CM | POA: Diagnosis not present

## 2015-04-08 DIAGNOSIS — I10 Essential (primary) hypertension: Secondary | ICD-10-CM | POA: Diagnosis not present

## 2015-04-08 DIAGNOSIS — S82402D Unspecified fracture of shaft of left fibula, subsequent encounter for closed fracture with routine healing: Secondary | ICD-10-CM | POA: Diagnosis not present

## 2015-04-08 DIAGNOSIS — D649 Anemia, unspecified: Secondary | ICD-10-CM | POA: Diagnosis not present

## 2015-04-15 DIAGNOSIS — D62 Acute posthemorrhagic anemia: Secondary | ICD-10-CM | POA: Diagnosis not present

## 2015-04-15 DIAGNOSIS — I10 Essential (primary) hypertension: Secondary | ICD-10-CM | POA: Diagnosis not present

## 2015-04-15 DIAGNOSIS — S22089A Unspecified fracture of T11-T12 vertebra, initial encounter for closed fracture: Secondary | ICD-10-CM | POA: Diagnosis not present

## 2015-04-15 DIAGNOSIS — M4848XA Fatigue fracture of vertebra, sacral and sacrococcygeal region, initial encounter for fracture: Secondary | ICD-10-CM | POA: Diagnosis not present

## 2015-04-15 DIAGNOSIS — S32050A Wedge compression fracture of fifth lumbar vertebra, initial encounter for closed fracture: Secondary | ICD-10-CM | POA: Diagnosis not present

## 2015-04-15 DIAGNOSIS — S82202A Unspecified fracture of shaft of left tibia, initial encounter for closed fracture: Secondary | ICD-10-CM | POA: Diagnosis not present

## 2015-04-15 DIAGNOSIS — Z6820 Body mass index (BMI) 20.0-20.9, adult: Secondary | ICD-10-CM | POA: Diagnosis not present

## 2015-04-20 DIAGNOSIS — S82402D Unspecified fracture of shaft of left fibula, subsequent encounter for closed fracture with routine healing: Secondary | ICD-10-CM | POA: Diagnosis not present

## 2015-04-20 DIAGNOSIS — S82202D Unspecified fracture of shaft of left tibia, subsequent encounter for closed fracture with routine healing: Secondary | ICD-10-CM | POA: Diagnosis not present

## 2015-04-20 DIAGNOSIS — I10 Essential (primary) hypertension: Secondary | ICD-10-CM | POA: Diagnosis not present

## 2015-04-20 DIAGNOSIS — D649 Anemia, unspecified: Secondary | ICD-10-CM | POA: Diagnosis not present

## 2015-04-20 DIAGNOSIS — S32059D Unspecified fracture of fifth lumbar vertebra, subsequent encounter for fracture with routine healing: Secondary | ICD-10-CM | POA: Diagnosis not present

## 2015-04-22 DIAGNOSIS — S82402D Unspecified fracture of shaft of left fibula, subsequent encounter for closed fracture with routine healing: Secondary | ICD-10-CM | POA: Diagnosis not present

## 2015-04-22 DIAGNOSIS — D649 Anemia, unspecified: Secondary | ICD-10-CM | POA: Diagnosis not present

## 2015-04-22 DIAGNOSIS — I10 Essential (primary) hypertension: Secondary | ICD-10-CM | POA: Diagnosis not present

## 2015-04-22 DIAGNOSIS — S32059D Unspecified fracture of fifth lumbar vertebra, subsequent encounter for fracture with routine healing: Secondary | ICD-10-CM | POA: Diagnosis not present

## 2015-04-22 DIAGNOSIS — S82202D Unspecified fracture of shaft of left tibia, subsequent encounter for closed fracture with routine healing: Secondary | ICD-10-CM | POA: Diagnosis not present

## 2015-04-23 DIAGNOSIS — S82202D Unspecified fracture of shaft of left tibia, subsequent encounter for closed fracture with routine healing: Secondary | ICD-10-CM | POA: Diagnosis not present

## 2015-04-23 DIAGNOSIS — S82402D Unspecified fracture of shaft of left fibula, subsequent encounter for closed fracture with routine healing: Secondary | ICD-10-CM | POA: Diagnosis not present

## 2015-04-23 DIAGNOSIS — D649 Anemia, unspecified: Secondary | ICD-10-CM | POA: Diagnosis not present

## 2015-04-23 DIAGNOSIS — I10 Essential (primary) hypertension: Secondary | ICD-10-CM | POA: Diagnosis not present

## 2015-04-23 DIAGNOSIS — S32059D Unspecified fracture of fifth lumbar vertebra, subsequent encounter for fracture with routine healing: Secondary | ICD-10-CM | POA: Diagnosis not present

## 2015-04-26 DIAGNOSIS — D649 Anemia, unspecified: Secondary | ICD-10-CM | POA: Diagnosis not present

## 2015-04-26 DIAGNOSIS — I10 Essential (primary) hypertension: Secondary | ICD-10-CM | POA: Diagnosis not present

## 2015-04-26 DIAGNOSIS — S82402D Unspecified fracture of shaft of left fibula, subsequent encounter for closed fracture with routine healing: Secondary | ICD-10-CM | POA: Diagnosis not present

## 2015-04-26 DIAGNOSIS — S82202D Unspecified fracture of shaft of left tibia, subsequent encounter for closed fracture with routine healing: Secondary | ICD-10-CM | POA: Diagnosis not present

## 2015-04-26 DIAGNOSIS — S32059D Unspecified fracture of fifth lumbar vertebra, subsequent encounter for fracture with routine healing: Secondary | ICD-10-CM | POA: Diagnosis not present

## 2015-04-28 DIAGNOSIS — D649 Anemia, unspecified: Secondary | ICD-10-CM | POA: Diagnosis not present

## 2015-04-28 DIAGNOSIS — I10 Essential (primary) hypertension: Secondary | ICD-10-CM | POA: Diagnosis not present

## 2015-04-28 DIAGNOSIS — S32059D Unspecified fracture of fifth lumbar vertebra, subsequent encounter for fracture with routine healing: Secondary | ICD-10-CM | POA: Diagnosis not present

## 2015-04-28 DIAGNOSIS — S82202D Unspecified fracture of shaft of left tibia, subsequent encounter for closed fracture with routine healing: Secondary | ICD-10-CM | POA: Diagnosis not present

## 2015-04-28 DIAGNOSIS — S82402D Unspecified fracture of shaft of left fibula, subsequent encounter for closed fracture with routine healing: Secondary | ICD-10-CM | POA: Diagnosis not present

## 2015-05-04 DIAGNOSIS — S32059D Unspecified fracture of fifth lumbar vertebra, subsequent encounter for fracture with routine healing: Secondary | ICD-10-CM | POA: Diagnosis not present

## 2015-05-04 DIAGNOSIS — I10 Essential (primary) hypertension: Secondary | ICD-10-CM | POA: Diagnosis not present

## 2015-05-04 DIAGNOSIS — S82202D Unspecified fracture of shaft of left tibia, subsequent encounter for closed fracture with routine healing: Secondary | ICD-10-CM | POA: Diagnosis not present

## 2015-05-04 DIAGNOSIS — S82402D Unspecified fracture of shaft of left fibula, subsequent encounter for closed fracture with routine healing: Secondary | ICD-10-CM | POA: Diagnosis not present

## 2015-05-04 DIAGNOSIS — D649 Anemia, unspecified: Secondary | ICD-10-CM | POA: Diagnosis not present

## 2015-05-06 DIAGNOSIS — S82402D Unspecified fracture of shaft of left fibula, subsequent encounter for closed fracture with routine healing: Secondary | ICD-10-CM | POA: Diagnosis not present

## 2015-05-06 DIAGNOSIS — S32059D Unspecified fracture of fifth lumbar vertebra, subsequent encounter for fracture with routine healing: Secondary | ICD-10-CM | POA: Diagnosis not present

## 2015-05-06 DIAGNOSIS — D649 Anemia, unspecified: Secondary | ICD-10-CM | POA: Diagnosis not present

## 2015-05-06 DIAGNOSIS — S82202D Unspecified fracture of shaft of left tibia, subsequent encounter for closed fracture with routine healing: Secondary | ICD-10-CM | POA: Diagnosis not present

## 2015-05-06 DIAGNOSIS — I10 Essential (primary) hypertension: Secondary | ICD-10-CM | POA: Diagnosis not present

## 2015-05-10 DIAGNOSIS — M4848XA Fatigue fracture of vertebra, sacral and sacrococcygeal region, initial encounter for fracture: Secondary | ICD-10-CM | POA: Diagnosis not present

## 2015-05-10 DIAGNOSIS — S32050A Wedge compression fracture of fifth lumbar vertebra, initial encounter for closed fracture: Secondary | ICD-10-CM | POA: Diagnosis not present

## 2015-05-10 DIAGNOSIS — S22089A Unspecified fracture of T11-T12 vertebra, initial encounter for closed fracture: Secondary | ICD-10-CM | POA: Diagnosis not present

## 2015-05-10 DIAGNOSIS — Z6820 Body mass index (BMI) 20.0-20.9, adult: Secondary | ICD-10-CM | POA: Diagnosis not present

## 2015-05-11 DIAGNOSIS — M5136 Other intervertebral disc degeneration, lumbar region: Secondary | ICD-10-CM | POA: Diagnosis not present

## 2015-05-11 DIAGNOSIS — S32050A Wedge compression fracture of fifth lumbar vertebra, initial encounter for closed fracture: Secondary | ICD-10-CM | POA: Diagnosis not present

## 2015-05-11 DIAGNOSIS — S22089A Unspecified fracture of T11-T12 vertebra, initial encounter for closed fracture: Secondary | ICD-10-CM | POA: Diagnosis not present

## 2015-05-11 DIAGNOSIS — M546 Pain in thoracic spine: Secondary | ICD-10-CM | POA: Diagnosis not present

## 2015-07-06 DIAGNOSIS — S82402D Unspecified fracture of shaft of left fibula, subsequent encounter for closed fracture with routine healing: Secondary | ICD-10-CM | POA: Diagnosis not present

## 2015-07-06 DIAGNOSIS — Z7982 Long term (current) use of aspirin: Secondary | ICD-10-CM | POA: Diagnosis not present

## 2015-07-06 DIAGNOSIS — S82202D Unspecified fracture of shaft of left tibia, subsequent encounter for closed fracture with routine healing: Secondary | ICD-10-CM | POA: Diagnosis not present

## 2015-07-26 DIAGNOSIS — E559 Vitamin D deficiency, unspecified: Secondary | ICD-10-CM | POA: Diagnosis not present

## 2015-07-26 DIAGNOSIS — Z9181 History of falling: Secondary | ICD-10-CM | POA: Diagnosis not present

## 2015-07-26 DIAGNOSIS — Z1389 Encounter for screening for other disorder: Secondary | ICD-10-CM | POA: Diagnosis not present

## 2015-07-26 DIAGNOSIS — I10 Essential (primary) hypertension: Secondary | ICD-10-CM | POA: Diagnosis not present

## 2015-07-26 DIAGNOSIS — S32050A Wedge compression fracture of fifth lumbar vertebra, initial encounter for closed fracture: Secondary | ICD-10-CM | POA: Diagnosis not present

## 2015-07-26 DIAGNOSIS — Z6820 Body mass index (BMI) 20.0-20.9, adult: Secondary | ICD-10-CM | POA: Diagnosis not present

## 2015-07-26 DIAGNOSIS — S22089A Unspecified fracture of T11-T12 vertebra, initial encounter for closed fracture: Secondary | ICD-10-CM | POA: Diagnosis not present

## 2015-07-26 DIAGNOSIS — M810 Age-related osteoporosis without current pathological fracture: Secondary | ICD-10-CM | POA: Diagnosis not present

## 2015-09-29 DIAGNOSIS — M79661 Pain in right lower leg: Secondary | ICD-10-CM | POA: Diagnosis not present

## 2015-09-29 DIAGNOSIS — M79604 Pain in right leg: Secondary | ICD-10-CM | POA: Diagnosis not present

## 2015-09-29 DIAGNOSIS — Z681 Body mass index (BMI) 19 or less, adult: Secondary | ICD-10-CM | POA: Diagnosis not present

## 2015-09-29 DIAGNOSIS — R600 Localized edema: Secondary | ICD-10-CM | POA: Diagnosis not present

## 2015-09-29 DIAGNOSIS — M7989 Other specified soft tissue disorders: Secondary | ICD-10-CM | POA: Diagnosis not present

## 2015-10-20 DIAGNOSIS — R351 Nocturia: Secondary | ICD-10-CM | POA: Diagnosis not present

## 2015-10-20 DIAGNOSIS — Z681 Body mass index (BMI) 19 or less, adult: Secondary | ICD-10-CM | POA: Diagnosis not present

## 2015-10-20 DIAGNOSIS — R350 Frequency of micturition: Secondary | ICD-10-CM | POA: Diagnosis not present

## 2015-10-20 DIAGNOSIS — N3281 Overactive bladder: Secondary | ICD-10-CM | POA: Diagnosis not present

## 2015-10-20 DIAGNOSIS — K219 Gastro-esophageal reflux disease without esophagitis: Secondary | ICD-10-CM | POA: Diagnosis not present

## 2016-01-24 DIAGNOSIS — M810 Age-related osteoporosis without current pathological fracture: Secondary | ICD-10-CM | POA: Diagnosis not present

## 2016-01-24 DIAGNOSIS — Z23 Encounter for immunization: Secondary | ICD-10-CM | POA: Diagnosis not present

## 2016-01-24 DIAGNOSIS — N3281 Overactive bladder: Secondary | ICD-10-CM | POA: Diagnosis not present

## 2016-01-24 DIAGNOSIS — S22089A Unspecified fracture of T11-T12 vertebra, initial encounter for closed fracture: Secondary | ICD-10-CM | POA: Diagnosis not present

## 2016-01-24 DIAGNOSIS — I10 Essential (primary) hypertension: Secondary | ICD-10-CM | POA: Diagnosis not present

## 2016-01-24 DIAGNOSIS — S32050A Wedge compression fracture of fifth lumbar vertebra, initial encounter for closed fracture: Secondary | ICD-10-CM | POA: Diagnosis not present

## 2016-01-24 DIAGNOSIS — Z681 Body mass index (BMI) 19 or less, adult: Secondary | ICD-10-CM | POA: Diagnosis not present

## 2016-01-24 DIAGNOSIS — K219 Gastro-esophageal reflux disease without esophagitis: Secondary | ICD-10-CM | POA: Diagnosis not present

## 2016-01-24 DIAGNOSIS — E559 Vitamin D deficiency, unspecified: Secondary | ICD-10-CM | POA: Diagnosis not present

## 2016-02-28 DIAGNOSIS — L570 Actinic keratosis: Secondary | ICD-10-CM | POA: Diagnosis not present

## 2016-02-28 DIAGNOSIS — D485 Neoplasm of uncertain behavior of skin: Secondary | ICD-10-CM | POA: Diagnosis not present

## 2016-07-31 DIAGNOSIS — Z9181 History of falling: Secondary | ICD-10-CM | POA: Diagnosis not present

## 2016-07-31 DIAGNOSIS — I10 Essential (primary) hypertension: Secondary | ICD-10-CM | POA: Diagnosis not present

## 2016-07-31 DIAGNOSIS — E559 Vitamin D deficiency, unspecified: Secondary | ICD-10-CM | POA: Diagnosis not present

## 2016-07-31 DIAGNOSIS — Z23 Encounter for immunization: Secondary | ICD-10-CM | POA: Diagnosis not present

## 2016-07-31 DIAGNOSIS — Z1389 Encounter for screening for other disorder: Secondary | ICD-10-CM | POA: Diagnosis not present

## 2016-07-31 DIAGNOSIS — S22089A Unspecified fracture of T11-T12 vertebra, initial encounter for closed fracture: Secondary | ICD-10-CM | POA: Diagnosis not present

## 2016-07-31 DIAGNOSIS — M810 Age-related osteoporosis without current pathological fracture: Secondary | ICD-10-CM | POA: Diagnosis not present

## 2016-07-31 DIAGNOSIS — K219 Gastro-esophageal reflux disease without esophagitis: Secondary | ICD-10-CM | POA: Diagnosis not present

## 2016-07-31 DIAGNOSIS — S32050A Wedge compression fracture of fifth lumbar vertebra, initial encounter for closed fracture: Secondary | ICD-10-CM | POA: Diagnosis not present

## 2016-08-06 DIAGNOSIS — G8929 Other chronic pain: Secondary | ICD-10-CM | POA: Diagnosis not present

## 2016-08-06 DIAGNOSIS — K5730 Diverticulosis of large intestine without perforation or abscess without bleeding: Secondary | ICD-10-CM | POA: Diagnosis not present

## 2016-08-06 DIAGNOSIS — I10 Essential (primary) hypertension: Secondary | ICD-10-CM | POA: Diagnosis not present

## 2016-08-06 DIAGNOSIS — K625 Hemorrhage of anus and rectum: Secondary | ICD-10-CM | POA: Diagnosis not present

## 2016-08-06 DIAGNOSIS — D62 Acute posthemorrhagic anemia: Secondary | ICD-10-CM | POA: Diagnosis not present

## 2016-08-06 DIAGNOSIS — K921 Melena: Secondary | ICD-10-CM | POA: Diagnosis not present

## 2016-08-06 DIAGNOSIS — K922 Gastrointestinal hemorrhage, unspecified: Secondary | ICD-10-CM | POA: Diagnosis not present

## 2016-08-06 DIAGNOSIS — Z79899 Other long term (current) drug therapy: Secondary | ICD-10-CM | POA: Diagnosis not present

## 2016-08-06 DIAGNOSIS — K5731 Diverticulosis of large intestine without perforation or abscess with bleeding: Secondary | ICD-10-CM | POA: Diagnosis present

## 2016-08-06 DIAGNOSIS — R6889 Other general symptoms and signs: Secondary | ICD-10-CM | POA: Diagnosis not present

## 2016-08-06 DIAGNOSIS — K602 Anal fissure, unspecified: Secondary | ICD-10-CM | POA: Diagnosis not present

## 2016-08-06 DIAGNOSIS — I700 Atherosclerosis of aorta: Secondary | ICD-10-CM | POA: Diagnosis not present

## 2016-08-06 DIAGNOSIS — Z7982 Long term (current) use of aspirin: Secondary | ICD-10-CM | POA: Diagnosis not present

## 2016-08-06 DIAGNOSIS — E876 Hypokalemia: Secondary | ICD-10-CM | POA: Diagnosis not present

## 2016-08-06 DIAGNOSIS — K624 Stenosis of anus and rectum: Secondary | ICD-10-CM | POA: Diagnosis not present

## 2016-08-08 DIAGNOSIS — K624 Stenosis of anus and rectum: Secondary | ICD-10-CM | POA: Diagnosis not present

## 2016-08-08 DIAGNOSIS — K5730 Diverticulosis of large intestine without perforation or abscess without bleeding: Secondary | ICD-10-CM | POA: Diagnosis not present

## 2016-08-08 DIAGNOSIS — K602 Anal fissure, unspecified: Secondary | ICD-10-CM | POA: Diagnosis not present

## 2016-08-15 DIAGNOSIS — D62 Acute posthemorrhagic anemia: Secondary | ICD-10-CM | POA: Diagnosis not present

## 2016-08-16 DIAGNOSIS — K922 Gastrointestinal hemorrhage, unspecified: Secondary | ICD-10-CM | POA: Diagnosis not present

## 2016-08-16 DIAGNOSIS — K219 Gastro-esophageal reflux disease without esophagitis: Secondary | ICD-10-CM | POA: Diagnosis not present

## 2016-08-16 DIAGNOSIS — Z6821 Body mass index (BMI) 21.0-21.9, adult: Secondary | ICD-10-CM | POA: Diagnosis not present

## 2016-08-16 DIAGNOSIS — K5731 Diverticulosis of large intestine without perforation or abscess with bleeding: Secondary | ICD-10-CM | POA: Diagnosis not present

## 2016-08-16 DIAGNOSIS — D62 Acute posthemorrhagic anemia: Secondary | ICD-10-CM | POA: Diagnosis not present

## 2016-08-22 DIAGNOSIS — Z8719 Personal history of other diseases of the digestive system: Secondary | ICD-10-CM | POA: Diagnosis not present

## 2016-09-04 DIAGNOSIS — Z1389 Encounter for screening for other disorder: Secondary | ICD-10-CM | POA: Diagnosis not present

## 2016-09-04 DIAGNOSIS — Z9181 History of falling: Secondary | ICD-10-CM | POA: Diagnosis not present

## 2016-09-04 DIAGNOSIS — Z0000 Encounter for general adult medical examination without abnormal findings: Secondary | ICD-10-CM | POA: Diagnosis not present

## 2016-09-05 ENCOUNTER — Encounter (HOSPITAL_COMMUNITY): Payer: Self-pay | Admitting: General Practice

## 2016-09-05 ENCOUNTER — Observation Stay (HOSPITAL_COMMUNITY)
Admission: EM | Admit: 2016-09-05 | Discharge: 2016-09-06 | Disposition: A | Discharge status: Home / Self Care | Payer: Medicare Other | Source: Other Acute Inpatient Hospital | Attending: Internal Medicine | Admitting: Internal Medicine

## 2016-09-05 DIAGNOSIS — M545 Low back pain: Secondary | ICD-10-CM | POA: Diagnosis not present

## 2016-09-05 DIAGNOSIS — K625 Hemorrhage of anus and rectum: Secondary | ICD-10-CM | POA: Diagnosis not present

## 2016-09-05 DIAGNOSIS — K624 Stenosis of anus and rectum: Secondary | ICD-10-CM | POA: Diagnosis present

## 2016-09-05 DIAGNOSIS — K921 Melena: Secondary | ICD-10-CM | POA: Diagnosis not present

## 2016-09-05 DIAGNOSIS — K922 Gastrointestinal hemorrhage, unspecified: Secondary | ICD-10-CM | POA: Diagnosis not present

## 2016-09-05 DIAGNOSIS — D62 Acute posthemorrhagic anemia: Secondary | ICD-10-CM

## 2016-09-05 DIAGNOSIS — D696 Thrombocytopenia, unspecified: Secondary | ICD-10-CM | POA: Diagnosis not present

## 2016-09-05 DIAGNOSIS — Z79899 Other long term (current) drug therapy: Secondary | ICD-10-CM | POA: Insufficient documentation

## 2016-09-05 DIAGNOSIS — K5900 Constipation, unspecified: Secondary | ICD-10-CM | POA: Diagnosis not present

## 2016-09-05 DIAGNOSIS — K602 Anal fissure, unspecified: Secondary | ICD-10-CM | POA: Diagnosis not present

## 2016-09-05 DIAGNOSIS — M8580 Other specified disorders of bone density and structure, unspecified site: Secondary | ICD-10-CM | POA: Insufficient documentation

## 2016-09-05 DIAGNOSIS — K5731 Diverticulosis of large intestine without perforation or abscess with bleeding: Secondary | ICD-10-CM

## 2016-09-05 DIAGNOSIS — F419 Anxiety disorder, unspecified: Secondary | ICD-10-CM | POA: Diagnosis present

## 2016-09-05 DIAGNOSIS — K5730 Diverticulosis of large intestine without perforation or abscess without bleeding: Secondary | ICD-10-CM | POA: Insufficient documentation

## 2016-09-05 DIAGNOSIS — K219 Gastro-esophageal reflux disease without esophagitis: Secondary | ICD-10-CM | POA: Diagnosis not present

## 2016-09-05 DIAGNOSIS — K5791 Diverticulosis of intestine, part unspecified, without perforation or abscess with bleeding: Secondary | ICD-10-CM | POA: Diagnosis not present

## 2016-09-05 DIAGNOSIS — I8393 Asymptomatic varicose veins of bilateral lower extremities: Secondary | ICD-10-CM

## 2016-09-05 DIAGNOSIS — R404 Transient alteration of awareness: Secondary | ICD-10-CM | POA: Diagnosis not present

## 2016-09-05 DIAGNOSIS — Z66 Do not resuscitate: Secondary | ICD-10-CM | POA: Diagnosis not present

## 2016-09-05 DIAGNOSIS — K5790 Diverticulosis of intestine, part unspecified, without perforation or abscess without bleeding: Secondary | ICD-10-CM | POA: Diagnosis present

## 2016-09-05 DIAGNOSIS — R42 Dizziness and giddiness: Secondary | ICD-10-CM | POA: Diagnosis not present

## 2016-09-05 DIAGNOSIS — H9190 Unspecified hearing loss, unspecified ear: Secondary | ICD-10-CM | POA: Insufficient documentation

## 2016-09-05 DIAGNOSIS — I10 Essential (primary) hypertension: Secondary | ICD-10-CM | POA: Diagnosis present

## 2016-09-05 HISTORY — DX: Stenosis of anus and rectum: K62.4

## 2016-09-05 HISTORY — DX: Anal fissure, unspecified: K60.2

## 2016-09-05 HISTORY — DX: Gastrointestinal hemorrhage, unspecified: K92.2

## 2016-09-05 HISTORY — DX: Asymptomatic varicose veins of bilateral lower extremities: I83.93

## 2016-09-05 HISTORY — DX: Anxiety disorder, unspecified: F41.9

## 2016-09-05 HISTORY — DX: Acute posthemorrhagic anemia: D62

## 2016-09-05 HISTORY — DX: Hemorrhage of anus and rectum: K62.5

## 2016-09-05 HISTORY — DX: Unspecified hearing loss, unspecified ear: H91.90

## 2016-09-05 HISTORY — DX: Gastro-esophageal reflux disease without esophagitis: K21.9

## 2016-09-05 HISTORY — DX: Essential (primary) hypertension: I10

## 2016-09-05 LAB — COMPREHENSIVE METABOLIC PANEL
ALK PHOS: 29 U/L — AB (ref 38–126)
ALT: 9 U/L — AB (ref 14–54)
AST: 19 U/L (ref 15–41)
Albumin: 2.8 g/dL — ABNORMAL LOW (ref 3.5–5.0)
Anion gap: 5 (ref 5–15)
BILIRUBIN TOTAL: 0.4 mg/dL (ref 0.3–1.2)
BUN: 15 mg/dL (ref 6–20)
CALCIUM: 8.3 mg/dL — AB (ref 8.9–10.3)
CO2: 24 mmol/L (ref 22–32)
CREATININE: 0.79 mg/dL (ref 0.44–1.00)
Chloride: 113 mmol/L — ABNORMAL HIGH (ref 101–111)
Glucose, Bld: 96 mg/dL (ref 65–99)
Potassium: 3.9 mmol/L (ref 3.5–5.1)
Sodium: 142 mmol/L (ref 135–145)
Total Protein: 4.5 g/dL — ABNORMAL LOW (ref 6.5–8.1)

## 2016-09-05 LAB — CBC WITH DIFFERENTIAL/PLATELET
Basophils Absolute: 0 10*3/uL (ref 0.0–0.1)
Basophils Relative: 0 %
Eosinophils Absolute: 0 10*3/uL (ref 0.0–0.7)
Eosinophils Relative: 1 %
HEMATOCRIT: 21 % — AB (ref 36.0–46.0)
HEMOGLOBIN: 6.8 g/dL — AB (ref 12.0–15.0)
LYMPHS ABS: 1.8 10*3/uL (ref 0.7–4.0)
LYMPHS PCT: 30 %
MCH: 31.5 pg (ref 26.0–34.0)
MCHC: 32.4 g/dL (ref 30.0–36.0)
MCV: 97.2 fL (ref 78.0–100.0)
Monocytes Absolute: 0.5 10*3/uL (ref 0.1–1.0)
Monocytes Relative: 8 %
NEUTROS PCT: 61 %
Neutro Abs: 3.6 10*3/uL (ref 1.7–7.7)
Platelets: 145 10*3/uL — ABNORMAL LOW (ref 150–400)
RBC: 2.16 MIL/uL — AB (ref 3.87–5.11)
RDW: 14.2 % (ref 11.5–15.5)
WBC: 5.9 10*3/uL (ref 4.0–10.5)

## 2016-09-05 LAB — ABO/RH: ABO/RH(D): O POS

## 2016-09-05 LAB — FERRITIN: Ferritin: 24 ng/mL (ref 11–307)

## 2016-09-05 LAB — IRON AND TIBC
Iron: 29 ug/dL (ref 28–170)
Saturation Ratios: 11 % (ref 10.4–31.8)
TIBC: 272 ug/dL (ref 250–450)
UIBC: 243 ug/dL

## 2016-09-05 LAB — RETICULOCYTES
RBC.: 2.16 MIL/uL — AB (ref 3.87–5.11)
RETIC COUNT ABSOLUTE: 38.9 10*3/uL (ref 19.0–186.0)
Retic Ct Pct: 1.8 % (ref 0.4–3.1)

## 2016-09-05 LAB — FOLATE: FOLATE: 35 ng/mL (ref >5.9)

## 2016-09-05 LAB — TSH: TSH: 2.883 u[IU]/mL (ref 0.350–4.500)

## 2016-09-05 LAB — PREPARE RBC (CROSSMATCH)

## 2016-09-05 LAB — VITAMIN B12: VITAMIN B 12: 895 pg/mL (ref 180–914)

## 2016-09-05 LAB — PHOSPHORUS: Phosphorus: 3 mg/dL (ref 2.5–4.6)

## 2016-09-05 LAB — MAGNESIUM: MAGNESIUM: 1.9 mg/dL (ref 1.7–2.4)

## 2016-09-05 MED ORDER — ACETAMINOPHEN 650 MG RE SUPP: 650.0000 mg | Freq: Four times a day (QID) | RECTAL | Status: DC | PRN

## 2016-09-05 MED ORDER — SODIUM CHLORIDE 0.9 % IV SOLN
INTRAVENOUS | Status: DC
  Administered 2016-09-05 – 2016-09-06 (×2): via INTRAVENOUS

## 2016-09-05 MED ORDER — TRAMADOL HCL 50 MG PO TABS: 50.0000 mg | ORAL_TABLET | Freq: Four times a day (QID) | ORAL | Status: DC | PRN

## 2016-09-05 MED ORDER — HYDRALAZINE HCL 20 MG/ML IJ SOLN
10.0000 mg | INTRAMUSCULAR | Status: DC | PRN
  Administered 2016-09-06: 10 mg via INTRAVENOUS
  Filled 2016-09-05: qty 1

## 2016-09-05 MED ORDER — ALPRAZOLAM 0.25 MG PO TABS
0.2500 mg | ORAL_TABLET | Freq: Three times a day (TID) | ORAL | Status: DC | PRN
  Administered 2016-09-05: 0.25 mg via ORAL
  Filled 2016-09-05: qty 1

## 2016-09-05 MED ORDER — ACETAMINOPHEN 325 MG PO TABS: 650.0000 mg | ORAL_TABLET | Freq: Four times a day (QID) | ORAL | Status: DC | PRN

## 2016-09-05 MED ORDER — FAMOTIDINE IN NACL 20-0.9 MG/50ML-% IV SOLN
20.0000 mg | INTRAVENOUS | Status: DC
  Administered 2016-09-05: 20 mg via INTRAVENOUS
  Filled 2016-09-05 (×2): qty 50

## 2016-09-05 MED ORDER — ONDANSETRON HCL 4 MG/2ML IJ SOLN: 4.0000 mg | Freq: Four times a day (QID) | INTRAMUSCULAR | Status: DC | PRN

## 2016-09-05 MED ORDER — SODIUM CHLORIDE 0.9 % IV SOLN
Freq: Once | INTRAVENOUS | Status: AC
  Administered 2016-09-05: 16:00:00 via INTRAVENOUS

## 2016-09-05 MED ORDER — ONDANSETRON HCL 4 MG PO TABS
4.0000 mg | ORAL_TABLET | Freq: Four times a day (QID) | ORAL | Status: DC | PRN
Start: 2016-09-05 — End: 2016-09-06

## 2016-09-05 MED ORDER — TRAMADOL 5 MG/ML ORAL SUSPENSION: 50.0000 mg | Freq: Four times a day (QID) | ORAL | Status: DC | PRN

## 2016-09-05 NOTE — H&P (Signed)
History and Physical    Melissa Hamilton ZOX:[id number] DOB: 12-30-1920 DOA: 09/05/2016   PCP: Nicoletta Dress, MD   Patient coming from/Resides with: Private residence/alone  Admission status: Observation/med-surg---it may be medically necessary to stay a minimum 2 midnights to rule out impending and/or unexpected changes in physiologic status that may differ from initial evaluation performed in the ER and/or at time of admission-consider reevaluation of admission status in 24 hours.   Chief Complaint: Bright red blood per rectum  HPI: Melissa Hamilton is a 83 y.o. female with medical history significant for osteopenia, lower extremity varicosities, hypertension, anxiety disorder, GERD, and known diverticulosis. The patient was recently hospitalized at [facility] between 5/6 and 5/9 for lower GI bleeding symptoms. Hemoglobin nadir at that time was 7.5 and she required 2 units PRBCs. Colonoscopy performed during that admission revealed moderate to severe diverticulosis (nonbleeding) in the sigmoid area as well as minor rectal stenosis and a small posterior nonbleeding anal fissure. Patient was discharged on stool softeners and high fiber diet.  Patient returned to the [facility] ER early this morning after awakening due to incontinence of large bloody bowel movement. Prior to this she had been experiencing black stools from iron. She is reporting low back pain and some rectal discomfort with passage of bowel movements. Hemoglobin at prior facility was 8.4 w/ BUN of 22. ED physician documented gross blood on DRE. Patient has had no further bright red blood stools. Due to no GI coverage at [facility] patient was transferred to this facility. She has remained hemodynamically stable and is alert and oriented.  ED Course: [facility]) Vital Signs: BP 134/63 (BP Location: Right Arm)   Pulse 76   Temp 98.2 F (36.8 C) (Oral)   Resp 16   SpO2 98%  Lab data: White count 7600,  hemoglobin 8.4, MCV 100, platelets 169,000, PT 11.1, INR 1.1, sodium 142, potassium 4.3, chloride 109, CO2 25, anion gap 12, BUN 22, creatinine 0.8, glucose 121, AST 41, albumin 3.3 Medications and treatments: Protonix 40 mg IV 1  Review of Systems:  In addition to the HPI above,  No Fever-chills, myalgias or other constitutional symptoms No Headache, changes with Vision or hearing, new weakness, tingling, numbness in any extremity, dizziness, dysarthria or word finding difficulty, gait disturbance or imbalance, tremors or seizure activity No problems swallowing food or Liquids, indigestion/reflux, choking or coughing while eating, abdominal pain with or after eating No Chest pain, Cough or Shortness of Breath, palpitations, orthopnea or DOE No Abdominal pain, N/V No dysuria, malodorous urine, hematuria or flank pain No new skin rashes, lesions, masses or bruises, No new joint pains, aches, swelling or redness No recent unintentional weight gain or loss No polyuria, polydypsia or polyphagia   Past Medical History:  Diagnosis Date  . Diverticulitis   . GERD (gastroesophageal reflux disease)   . GI bleed   . HOH (hard of hearing)   . Hypertension     Past Surgical History:  Procedure Laterality Date  . COLONOSCOPY    . HEMORROIDECTOMY    . TONSILLECTOMY      Social History   Social History  . Marital status: Single    Spouse name: N/A  . Number of children: N/A  . Years of education: N/A   Occupational History  . Not on file.   Social History Main Topics  . Smoking status: Never Smoker  . Smokeless tobacco: Never Used  . Alcohol use No  . Drug use: No  . Sexual  activity: Not on file   Other Topics Concern  . Not on file   Social History Narrative  . No narrative on file    Mobility: Independent Work history: Not obtained   No Known Allergies  Family history reviewed and not pertinent to current admission diagnosis or findings  Prior to Admission  medications   Medication Sig Start Date End Date Taking? Authorizing Provider  aliskiren (TEKTURNA) 150 MG tablet Take 150 mg by mouth at bedtime.   Yes [provider]  ALPRAZolam (XANAX) 0.25 MG tablet Take 0.25 mg by mouth every 8 (eight) hours as needed for anxiety.   Yes [provider]  Biotin 5 MG CAPS Take 5 mg by mouth daily.   Yes [provider]  bisacodyl (DULCOLAX) 10 MG suppository Place 10 mg rectally daily as needed for moderate constipation.   Yes [provider]  Calcium Carbonate-Vit D-Min (CALTRATE 600+D PLUS PO) Take 1 tablet by mouth 2 (two) times daily.   Yes [provider]  docusate sodium (COLACE) 100 MG capsule Take 100 mg by mouth daily.   Yes [provider]  famotidine (PEPCID) 20 MG tablet Take 20 mg by mouth 2 (two) times daily.   Yes [provider]  GLUCOSAMINE-CHONDROITIN PO Take 1 capsule by mouth daily.   Yes [provider]  magnesium gluconate (MAGONATE) 500 MG tablet Take 500 mg by mouth daily.   Yes [provider]  Multiple Vitamins-Minerals (MULTIVITAMIN PO) Take 1 tablet by mouth daily.   Yes [provider]  Omega-3 Fatty Acids (FISH OIL) 1000 MG CAPS Take 1,000 mg by mouth 2 (two) times daily.   Yes [provider]  polyethylene glycol (MIRALAX / GLYCOLAX) packet Take 17 g by mouth daily as needed for moderate constipation.   Yes [provider]  potassium chloride (K-DUR,KLOR-CON) 10 MEQ tablet Take 10 mEq by mouth 2 (two) times daily.   Yes [provider]  risedronate (ACTONEL) 150 MG tablet Take 150 mg by mouth every 30 (thirty) days. with water on empty stomach, nothing by mouth or lie down for next 30 minutes.   Yes [provider]  traMADol (ULTRAM) 50 MG tablet Take 50 mg by mouth 4 (four) times daily as needed for moderate pain.   Yes [provider]  triamcinolone cream (KENALOG) 0.1 % Apply 1 application  topically 2 (two) times daily as needed (for skin irritation).   Yes [provider]  vitamin B-12 (CYANOCOBALAMIN) 1000 MCG tablet Take 1,000 mcg by mouth daily.   Yes [provider]  vitamin C (ASCORBIC ACID) 500 MG tablet Take 500 mg by mouth daily.   Yes [provider]    Physical Exam: Vitals:   09/05/16 0724  BP: (!) 134/46  Pulse: 78  Resp: 18  Temp: 98.2 F (36.8 C)  TempSrc: Oral  SpO2: 100%      Constitutional: NAD, calm, comfortable-Pale in appearance Eyes: PERRL, lids normal, conjunctivae pale ENMT: Mucous membranes are moist. Posterior pharynx clear of any exudate or lesions.Normal dentition.  Neck: normal, supple, no masses, no thyromegaly Respiratory: clear to auscultation bilaterally, no wheezing, no crackles. Normal respiratory effort. No accessory muscle use.  Cardiovascular: Regular rate and rhythm, no murmurs / rubs / gallops. No extremity edema. 2+ pedal pulses. No carotid bruits.  Abdomen: no tenderness, no masses palpated. No hepatosplenomegaly. Bowel sounds positive.  Rectal: No obvious bleeding, multiple rectal tags without definite hemorrhoids, DRE deferred Musculoskeletal: no clubbing /  cyanosis. No joint deformity upper and lower extremities. Good ROM, no contractures. Normal muscle tone.  Skin: no rashes, lesions, ulcers. No induration Neurologic: CN 2-12 grossly intact. Sensation intact, DTR normal. Strength 5/5 x all 4 extremities.  Psychiatric: Alert and oriented x 3 although mildly confused. Normal mood.    Labs on Admission: I have personally reviewed following labs and imaging studies  CBC: No results for input(s): WBC, NEUTROABS, HGB, HCT, MCV, PLT in the last 168 hours. Basic Metabolic Panel: No results for input(s): NA, K, CL, CO2, GLUCOSE, BUN, CREATININE, CALCIUM, MG, PHOS in the last 168 hours. GFR: CrCl cannot be calculated (No order found.). Liver Function Tests: No results for input(s): AST, ALT,  ALKPHOS, BILITOT, PROT, ALBUMIN in the last 168 hours. No results for input(s): LIPASE, AMYLASE in the last 168 hours. No results for input(s): AMMONIA in the last 168 hours. Coagulation Profile: No results for input(s): INR, PROTIME in the last 168 hours. Cardiac Enzymes: No results for input(s): CKTOTAL, CKMB, CKMBINDEX, TROPONINI in the last 168 hours. BNP (last 3 results) No results for input(s): PROBNP in the last 8760 hours. HbA1C: No results for input(s): HGBA1C in the last 72 hours. CBG: No results for input(s): GLUCAP in the last 168 hours. Lipid Profile: No results for input(s): CHOL, HDL, LDLCALC, TRIG, CHOLHDL, LDLDIRECT in the last 72 hours. Thyroid Function Tests: No results for input(s): TSH, T4TOTAL, FREET4, T3FREE, THYROIDAB in the last 72 hours. Anemia Panel: No results for input(s): VITAMINB12, FOLATE, FERRITIN, TIBC, IRON, RETICCTPCT in the last 72 hours. Urine analysis: No results found for: COLORURINE, APPEARANCEUR, LABSPEC, PHURINE, GLUCOSEU, HGBUR, BILIRUBINUR, KETONESUR, PROTEINUR, UROBILINOGEN, NITRITE, LEUKOCYTESUR Sepsis Labs: @LABRCNTIP (procalcitonin:4,lacticidven:4) )No results found for this or any previous visit (from the past 240 hour(s)).   Radiological Exams on Admission: No results found.   Assessment/Plan Principal Problem:   Rectal bleeding/Diverticulosis -Presents with recurrence of rectal bleeding with known diverticulosis; symptoms associated low back pain and rectal pain/pressure with passage of bowel movements -Currently no signs of active bleeding -Underwent colonoscopy 5/8 that revealed sigmoid diverticulosis, mild rectal stenosis and nonbleeding anal fissure -Prior to above admit pt had been started on ASA- may need to stop -Continue to monitor for recurrent bleeding -Consider inpatient GI consultation if bleeding resumes otherwise can follow up with Dr. Lyndel Safe after discharge  Active Problems:   Rectal stenosis/Anal  fissure -Likely explanation for patient's low back pain and rectal pressure/pain with passage of bowel movements -Consider rectal steroid such as Anusol or Proctofoam for 7-10 day -Continue high-fiber diet and stool softeners at discharge    Acute blood loss anemia -During previous hospitalization nadir was 7.5 and she required 2 units PRBCs -At discharge hemoglobin was 8.9 -Hemoglobin at [facility] ER was 8.4 -Repeat CBC now and again in a.m. -Obtain TSH and anemia panel    GERD (gastroesophageal reflux disease) -Continue Pepcid    Hypertension -IV hydralazine prn while NPO -Holding home Tekturna    Varicose veins of both lower extremities -No signs of lower extremity edema    Anxiety disorder -Continue prn Xanax with sip of water       DVT prophylaxis: SCDs Code Status: DO NOT RESUSCITATE  Family Communication: No family at bedside Disposition Plan: Home Consults called: None     ELLIS,ALLISON L. ANP-BC [facility] Pager [phone]   If 7PM-7AM, please contact night-coverage [email or web address] Password [email or web address]  09/05/2016, 10:04 AM

## 2016-09-05 NOTE — Consult Note (Signed)
Referring Provider: Erin Hearing, NP [facility] Primary Care Physician:  Nicoletta Dress, MD Primary Gastroenterologist:  Dr. Lyndel Safe in [place]  Reason for Consultation:  GI bleed  HPI: Melissa Hamilton is a 83 y.o. female with medical history significant for osteopenia, lower extremity varicosities, hypertension, anxiety disorder, GERD, and known diverticulosis. The patient was recently hospitalized at [facility] between 5/6 and 5/9 for lower GI bleeding symptoms. Hemoglobin nadir at that time was 7.5  Grams and she required 2 units PRBCs. Colonoscopy performed during that admission by Dr. Lyndel Safe revealed moderate to severe diverticulosis (nonbleeding) in the sigmoid area as well as minor rectal stenosis and a small posterior nonbleeding anal fissure. Patient was discharged on stool softeners and high fiber diet.  Patient returned to the [facility] ER early this morning after passing several large bloody bowel movements. She tells me that starting the evening of 6/4 she began passing blood per rectum with clots, cannot quantify how many times because it just seemed continuously coming from her rectum. She does also reporting low back pain and some rectal discomfort with passage of bowel movements. Says that sometimes she has to strain and that the stool softeners do not seem to be helping much.  Hemoglobin at prior facility was 8.4 w/ BUN of 22. ED physician documented gross blood on DRE.  Due to no GI coverage at [facility] patient was transferred to this facility. She has remained hemodynamically stable and is alert and oriented.  Hgb now 6.8 grams.  She is going to be transfused.  Has passed some blood since being here at [place], but not as much compared to what she was having at home.   Past Medical History:  Diagnosis Date  . GERD (gastroesophageal reflux disease)   . GI bleed   . HOH (hard of hearing)   . Hypertension     Past Surgical History:  Procedure Laterality Date    . COLONOSCOPY    . HEMORROIDECTOMY    . TONSILLECTOMY      Prior to Admission medications   Medication Sig Start Date End Date Taking? Authorizing Provider  aliskiren (TEKTURNA) 150 MG tablet Take 150 mg by mouth at bedtime.   Yes [provider]  ALPRAZolam (XANAX) 0.25 MG tablet Take 0.25 mg by mouth every 8 (eight) hours as needed for anxiety.   Yes [provider]  Biotin 5 MG CAPS Take 5 mg by mouth daily.   Yes [provider]  bisacodyl (DULCOLAX) 10 MG suppository Place 10 mg rectally daily as needed for moderate constipation.   Yes [provider]  Calcium Carbonate-Vit D-Min (CALTRATE 600+D PLUS PO) Take 1 tablet by mouth 2 (two) times daily.   Yes [provider]  docusate sodium (COLACE) 100 MG capsule Take 100 mg by mouth daily.   Yes [provider]  famotidine (PEPCID) 20 MG tablet Take 20 mg by mouth 2 (two) times daily.   Yes [provider]  GLUCOSAMINE-CHONDROITIN PO Take 1 capsule by mouth daily.   Yes [provider]  magnesium gluconate (MAGONATE) 500 MG tablet Take 500 mg by mouth daily.   Yes [provider]  Multiple Vitamins-Minerals (MULTIVITAMIN PO) Take 1 tablet by mouth daily.   Yes [provider]  Omega-3 Fatty Acids (FISH OIL) 1000 MG CAPS Take 1,000 mg by mouth 2 (two) times daily.   Yes [provider]  polyethylene glycol (MIRALAX / GLYCOLAX) packet Take 17 g by mouth daily as needed  for moderate constipation.   Yes [provider]  potassium chloride (K-DUR,KLOR-CON) 10 MEQ tablet Take 10 mEq by mouth 2 (two) times daily.   Yes [provider]  risedronate (ACTONEL) 150 MG tablet Take 150 mg by mouth every 30 (thirty) days. with water on empty stomach, nothing by mouth or lie down for next 30 minutes.   Yes [provider]  traMADol (ULTRAM) 50 MG tablet Take 50 mg by mouth 4 (four) times daily as needed for moderate pain.   Yes  [provider]  triamcinolone cream (KENALOG) 0.1 % Apply 1 application topically 2 (two) times daily as needed (for skin irritation).   Yes [provider]  vitamin B-12 (CYANOCOBALAMIN) 1000 MCG tablet Take 1,000 mcg by mouth daily.   Yes [provider]  vitamin C (ASCORBIC ACID) 500 MG tablet Take 500 mg by mouth daily.   Yes [provider]    Current Facility-Administered Medications  Medication Dose Route Frequency Provider Last Rate Last Dose  . 0.9 %  sodium chloride infusion   Intravenous Continuous Samella Parr, NP 75 mL/hr at 09/05/16 1003    . 0.9 %  sodium chloride infusion   Intravenous Once Samella Parr, NP      . acetaminophen (TYLENOL) tablet 650 mg  650 mg Oral Q6H PRN Samella Parr, NP       Or  . acetaminophen (TYLENOL) suppository 650 mg  650 mg Rectal Q6H PRN Samella Parr, NP      . ALPRAZolam Duanne Moron) tablet 0.25 mg  0.25 mg Oral Q8H PRN Samella Parr, NP      . famotidine (PEPCID) IVPB 20 mg premix  20 mg Intravenous Q24H Samella Parr, NP   Stopped at 09/05/16 1121  . hydrALAZINE (APRESOLINE) injection 10 mg  10 mg Intravenous Q4H PRN Samella Parr, NP      . ondansetron (ZOFRAN) tablet 4 mg  4 mg Oral Q6H PRN Samella Parr, NP       Or  . ondansetron [facility]) injection 4 mg  4 mg Intravenous Q6H PRN Samella Parr, NP        Allergies as of 09/05/2016  . (No Known Allergies)    History reviewed. No pertinent family history.  Social History   Social History  . Marital status: Single    Spouse name: N/A  . Number of children: N/A  . Years of education: N/A   Occupational History  . Not on file.   Social History Main Topics  . Smoking status: Never Smoker  . Smokeless tobacco: Never Used  . Alcohol use No  . Drug use: No  . Sexual activity: Not on file   Other Topics Concern  . Not on file   Social History Narrative  . No narrative on file    Review of Systems: ROS negative  except as mentioned in HPI.  Physical Exam: Vital signs in last 24 hours: Temp:  [98.2 F (36.8 C)-98.9 F (37.2 C)] 98.9 F (37.2 C) (06/05 1419) Pulse Rate:  [78-79] 79 (06/05 1419) Resp:  [18] 18 (06/05 1419) BP: (134-158)/(46-66) 158/66 (06/05 1419) SpO2:  [100 %] 100 % (06/05 1419) Last BM Date: 09/05/16 General:  Alert, Well-developed, well-nourished, pleasant and cooperative in NAD Head:  Normocephalic and atraumatic. Eyes:  Sclera clear, no icterus.  Conjunctiva pink. Ears:  Normal auditory acuity. Mouth:  No deformity or lesions.   Lungs:  Clear throughout to auscultation.  No wheezes, crackles, or rhonchi.  Heart:  Regular rate and rhythm; no murmurs, clicks, rubs, or gallops. Abdomen:  Soft, non-distended.  BS present.  Non-tender. Rectal:  Deferred  Msk:  Symmetrical without gross deformities. Pulses:  Normal pulses noted. Extremities:  Without clubbing or edema. Neurologic:  Alert and oriented x 4;  grossly normal neurologically. Skin:  Intact without significant lesions or rashes. Psych:  Alert and cooperative. Normal mood and affect.  Intake/Output from previous day: No intake/output data recorded. Intake/Output this shift: Total I/O In: -  Out: 200 [Urine:200]  Lab Results:  Recent Labs  09/05/16 1154  WBC 5.9  HGB 6.8*  HCT 21.0*  PLT 145*   BMET  Recent Labs  09/05/16 1154  NA 142  K 3.9  CL 113*  CO2 24  GLUCOSE 96  BUN 15  CREATININE 0.79  CALCIUM 8.3*   LFT  Recent Labs  09/05/16 1154  PROT 4.5*  ALBUMIN 2.8*  AST 19  ALT 9*  ALKPHOS 29*  BILITOT 0.4   IMPRESSION:  -GI bleed:  Likely diverticular.  Had a similar episode one month ago with colonoscopy negative for any other bleeding source. -ABLA:  Secondary to GI bleed.  Hgb 6.8 grams.  Going to be transfused. -Constipation  PLAN: -Continue to monitor Hgb and transfuse prn. -If re-bleeds briskly then will need NM bleeding scan or CT angio and possible IR consult for  angio. -Should be started on Miralax once to twice daily upon discharge instead of colace in order to prevent constipation and straining.  ZEHR, JESSICA D.  09/05/2016, 2:29 PM  Pager number [phone]   Attending physician's note   I have taken a history, examined the patient and reviewed the chart. I agree with the Advanced Practitioner's note, impression and recommendations. 83 year old female recently hospitalized at [facility] in May 2018 with hematochezia underwent colonoscopy by Dr. Lyndel Safe which showed sigmoid diverticulosis and also anal fissure. She presented with recurrent episode of pain less hematochezia last night, no further episodes since admission. Her presentation is concerning for recurrent diverticular hemorrhage Continue to monitor hemoglobin and transfuse as needed Nuclear medicine RBC scan if has recurrent episode of hematochezia and consult IR for possible angioembolization   K Denzil Magnuson, MD [phone] Mon-Fri 8a-5p [phone] after 5p, weekends, holidays

## 2016-09-05 NOTE — Progress Notes (Signed)
CRITICAL VALUE ALERT  Critical Value:  HGB 6.8  Date & Time Notied:  09/05/2016 1229  Provider Notified: Marily Memos   Orders Received/Actions taken: 2 units blood

## 2016-09-05 NOTE — Progress Notes (Signed)
Patient arrived on the unit this am made comfortable and given call bell within reach. Notified patient placement of patient presences.

## 2016-09-05 NOTE — Progress Notes (Addendum)
Hgb has decreased from 8.4 to 6.8 so will consult GI for recs. Platelets have dropped to 145,000-likely 2/2 consumption  Standard GI/Nandigam on call  Erin Hearing, ANP

## 2016-09-06 DIAGNOSIS — K921 Melena: Secondary | ICD-10-CM | POA: Diagnosis not present

## 2016-09-06 DIAGNOSIS — K625 Hemorrhage of anus and rectum: Secondary | ICD-10-CM | POA: Diagnosis not present

## 2016-09-06 DIAGNOSIS — Z66 Do not resuscitate: Secondary | ICD-10-CM | POA: Diagnosis not present

## 2016-09-06 DIAGNOSIS — D62 Acute posthemorrhagic anemia: Secondary | ICD-10-CM | POA: Diagnosis not present

## 2016-09-06 DIAGNOSIS — K602 Anal fissure, unspecified: Secondary | ICD-10-CM

## 2016-09-06 DIAGNOSIS — K219 Gastro-esophageal reflux disease without esophagitis: Secondary | ICD-10-CM | POA: Diagnosis not present

## 2016-09-06 DIAGNOSIS — M8580 Other specified disorders of bone density and structure, unspecified site: Secondary | ICD-10-CM | POA: Diagnosis not present

## 2016-09-06 DIAGNOSIS — K5731 Diverticulosis of large intestine without perforation or abscess with bleeding: Secondary | ICD-10-CM | POA: Diagnosis not present

## 2016-09-06 DIAGNOSIS — I10 Essential (primary) hypertension: Secondary | ICD-10-CM | POA: Diagnosis not present

## 2016-09-06 DIAGNOSIS — F419 Anxiety disorder, unspecified: Secondary | ICD-10-CM | POA: Diagnosis not present

## 2016-09-06 LAB — TYPE AND SCREEN
ABO/RH(D): O POS
ANTIBODY SCREEN: NEGATIVE
UNIT DIVISION: 0
UNIT DIVISION: 0

## 2016-09-06 LAB — BASIC METABOLIC PANEL
Anion gap: 9 (ref 5–15)
BUN: 9 mg/dL (ref 6–20)
CHLORIDE: 112 mmol/L — AB (ref 101–111)
CO2: 22 mmol/L (ref 22–32)
Calcium: 8 mg/dL — ABNORMAL LOW (ref 8.9–10.3)
Creatinine, Ser: 0.77 mg/dL (ref 0.44–1.00)
GFR calc Af Amer: >60 mL/min (ref >60)
Glucose, Bld: 95 mg/dL (ref 65–99)
POTASSIUM: 3.6 mmol/L (ref 3.5–5.1)
SODIUM: 143 mmol/L (ref 135–145)

## 2016-09-06 LAB — CBC
HCT: 31.3 % — ABNORMAL LOW (ref 36.0–46.0)
HEMATOCRIT: 30.7 % — AB (ref 36.0–46.0)
HEMATOCRIT: 31.2 % — AB (ref 36.0–46.0)
HEMOGLOBIN: 10.4 g/dL — AB (ref 12.0–15.0)
Hemoglobin: 10.2 g/dL — ABNORMAL LOW (ref 12.0–15.0)
Hemoglobin: 10.2 g/dL — ABNORMAL LOW (ref 12.0–15.0)
MCH: 29.7 pg (ref 26.0–34.0)
MCH: 30.2 pg (ref 26.0–34.0)
MCH: 30.2 pg (ref 26.0–34.0)
MCHC: 33.2 g/dL (ref 30.0–36.0)
MCHC: 33.2 g/dL (ref 30.0–36.0)
MCHC: 32.7 g/dL (ref 30.0–36.0)
MCV: 91 fL (ref 78.0–100.0)
MCV: 91 fL (ref 78.0–100.0)
MCV: 90.8 fL (ref 78.0–100.0)
PLATELETS: 121 10*3/uL — AB (ref 150–400)
PLATELETS: 128 10*3/uL — AB (ref 150–400)
Platelets: 132 10*3/uL — ABNORMAL LOW (ref 150–400)
RBC: 3.43 MIL/uL — ABNORMAL LOW (ref 3.87–5.11)
RBC: 3.44 MIL/uL — AB (ref 3.87–5.11)
RBC: 3.38 MIL/uL — AB (ref 3.87–5.11)
RDW: 18.2 % — ABNORMAL HIGH (ref 11.5–15.5)
RDW: 18.4 % — ABNORMAL HIGH (ref 11.5–15.5)
RDW: 17.8 % — AB (ref 11.5–15.5)
WBC: 7.7 10*3/uL (ref 4.0–10.5)
WBC: 5.8 10*3/uL (ref 4.0–10.5)
WBC: 6.8 10*3/uL (ref 4.0–10.5)

## 2016-09-06 LAB — BPAM RBC
BLOOD PRODUCT EXPIRATION DATE: 201806262359
BLOOD PRODUCT EXPIRATION DATE: 201806262359
ISSUE DATE / TIME: 201806051638
ISSUE DATE / TIME: 201806052014
UNIT TYPE AND RH: 5100
Unit Type and Rh: 5100

## 2016-09-06 MED ORDER — HYDROCORTISONE ACETATE 25 MG RE SUPP
25.0000 mg | Freq: Two times a day (BID) | RECTAL | 2 refills | Status: DC
Start: 2016-09-06 — End: 2016-09-06

## 2016-09-06 MED ORDER — POLYETHYLENE GLYCOL 3350 17 G PO PACK
17.0000 g | PACK | Freq: Every day | ORAL | Status: DC
  Administered 2016-09-06: 17 g via ORAL

## 2016-09-06 MED ORDER — POLYETHYLENE GLYCOL 3350 17 G PO PACK: 17.0000 g | PACK | Freq: Every day | ORAL | Status: DC | PRN

## 2016-09-06 MED ORDER — FAMOTIDINE 20 MG PO TABS
20.0000 mg | ORAL_TABLET | Freq: Every day | ORAL | Status: DC
Start: 2016-09-06 — End: 2016-09-06
  Administered 2016-09-06: 20 mg via ORAL
  Filled 2016-09-06: qty 1

## 2016-09-06 MED ORDER — POLYETHYLENE GLYCOL 3350 17 G PO PACK: 17.0000 g | PACK | Freq: Every day | ORAL | 1 refills | Status: AC

## 2016-09-06 MED ORDER — SENNOSIDES-DOCUSATE SODIUM 8.6-50 MG PO TABS
1.0000 | ORAL_TABLET | Freq: Two times a day (BID) | ORAL | Status: DC
  Administered 2016-09-06: 1 via ORAL
  Filled 2016-09-06: qty 1

## 2016-09-06 MED ORDER — BENEFIBER PO POWD: ORAL | 3 refills | Status: AC

## 2016-09-06 MED ORDER — SENNOSIDES-DOCUSATE SODIUM 8.6-50 MG PO TABS: 1.0000 | ORAL_TABLET | Freq: Two times a day (BID) | ORAL | 3 refills | Status: DC

## 2016-09-06 MED ORDER — SENNOSIDES-DOCUSATE SODIUM 8.6-50 MG PO TABS: 1.0000 | ORAL_TABLET | Freq: Every evening | ORAL | 3 refills | Status: AC | PRN

## 2016-09-06 MED ORDER — HYDROCORTISONE ACETATE 25 MG RE SUPP: 25.0000 mg | Freq: Two times a day (BID) | RECTAL | 2 refills | Status: AC

## 2016-09-06 MED ORDER — HYDROCORTISONE ACETATE 25 MG RE SUPP
25.0000 mg | Freq: Two times a day (BID) | RECTAL | Status: DC
  Administered 2016-09-06: 25 mg via RECTAL
  Filled 2016-09-06: qty 1

## 2016-09-06 NOTE — Discharge Summary (Signed)
Physician Discharge Summary   Patient ID: Melissa Hamilton MRN: [id number] DOB/AGE: 81-Aug-1922 81 y.o.  Admit date: 09/05/2016 Discharge date: 09/06/2016  Primary Care Physician:  Nicoletta Dress, MD  Discharge Diagnoses:    . Anal fissure . Bright red rectal bleeding . Acute blood loss anemia . Diverticulosis . GERD (gastroesophageal reflux disease) . Hypertension . Varicose veins of both lower extremities . Anxiety disorder . Rectal stenosis . Generalized deconditioning   Consults: Gastroenterology  Recommendations for Outpatient Follow-up:  1. Patient was recommended to avoid constipation, hold aspirin. She was placed on bowel regimen. 2. Please repeat CBC/BMET at next visit   DIET: Heart healthy diet    Allergies:  No Known Allergies   DISCHARGE MEDICATIONS: Current Discharge Medication List    START taking these medications   Details  hydrocortisone (ANUSOL-HC) 25 MG suppository Place 1 suppository (25 mg total) rectally 2 (two) times daily. Qty: 100 suppository, Refills: 2    senna-docusate (SENOKOT-S) 8.6-50 MG tablet Take 1 tablet by mouth at bedtime as needed for mild constipation or moderate constipation. Qty: 60 tablet, Refills: 3    Wheat Dextrin (BENEFIBER) POWD Benefiber 1 tablespoon with meals 3 times daily Qty: 245 g, Refills: 3      CONTINUE these medications which have CHANGED   Details  polyethylene glycol (MIRALAX / GLYCOLAX) packet Take 17 g by mouth daily. Qty: 30 each, Refills: 1      CONTINUE these medications which have NOT CHANGED   Details  aliskiren (TEKTURNA) 150 MG tablet Take 150 mg by mouth at bedtime.    ALPRAZolam (XANAX) 0.25 MG tablet Take 0.25 mg by mouth every 8 (eight) hours as needed for anxiety.    Biotin 5 MG CAPS Take 5 mg by mouth daily.    Calcium Carbonate-Vit D-Min (CALTRATE 600+D PLUS PO) Take 1 tablet by mouth 2 (two) times daily.    famotidine (PEPCID) 20 MG tablet Take 20 mg by mouth 2 (two) times  daily.    GLUCOSAMINE-CHONDROITIN PO Take 1 capsule by mouth daily.    magnesium gluconate (MAGONATE) 500 MG tablet Take 500 mg by mouth daily.    Multiple Vitamins-Minerals (MULTIVITAMIN PO) Take 1 tablet by mouth daily.    Omega-3 Fatty Acids (FISH OIL) 1000 MG CAPS Take 1,000 mg by mouth 2 (two) times daily.    risedronate (ACTONEL) 150 MG tablet Take 150 mg by mouth every 30 (thirty) days. with water on empty stomach, nothing by mouth or lie down for next 30 minutes.    traMADol (ULTRAM) 50 MG tablet Take 50 mg by mouth 4 (four) times daily as needed for moderate pain.    triamcinolone cream (KENALOG) 0.1 % Apply 1 application topically 2 (two) times daily as needed (for skin irritation).    vitamin B-12 (CYANOCOBALAMIN) 1000 MCG tablet Take 1,000 mcg by mouth daily.    vitamin C (ASCORBIC ACID) 500 MG tablet Take 500 mg by mouth daily.      STOP taking these medications     bisacodyl (DULCOLAX) 10 MG suppository      docusate sodium (COLACE) 100 MG capsule      potassium chloride (K-DUR,KLOR-CON) 10 MEQ tablet          Brief H and P: For complete details please refer to admission H and P, but in brief, patient is a 83 year old female with osteopenia, hypertension, anxiety disorder, GERD, known history of diverticulosis was recently hospitalized at [facility] between 5/6 and 5/9 for lower GI bleeding  symptoms. Hemoglobin nadir at that time was 7.5 and she required 2 units PRBCs. Colonoscopy performed during that admission revealed moderate to severe diverticulosis (nonbleeding) in the sigmoid area as well as minor rectal stenosis and a small posterior nonbleeding anal fissure. Patient was discharged on stool softeners and high fiber diet.Patient returned to the [facility] ER early this morning after awakening due to incontinence of large bloody bowel movement. Prior to this she had been experiencing black stools from iron. She reported low back pain and some rectal  discomfort with passage of bowel movements. Hemoglobin at prior facility was 8.4 w/ BUN of 22. ED physician documented gross blood on DRE. Patient has had no further bright red blood stools. Due to no GI coverage at [facility] patient was transferred to [facility].   Hospital Course:   Rectal bleeding/Diverticulosis - Patient presented with this recurrence of rectal bleeding with known history of diverticulosis. She recently had a colonoscopy performed during previous admission last month at [facility] which showed moderate to severe diverticulosis in the sigmoid area, minor rectal stenosis and small posterior nonbleeding anal fissure.  -Currently no signs of active bleeding. Patient was recommended to stop aspirin. -Hemoglobin was 6.8 at the time of admission, transfuse 2 units of packed RBCs, hemoglobin remained stable at 10.4 at the time of discharge - GI was consulted, patient was seen by Dr.Nandigam, felt, patient is likely passing residual blood in the colon, no active bleeding at this time, hemoglobin has remained stable, patient can continue regular diet. Patient was placed on Benefiber, MiraLAX, Senokot for bowel regimen. She has to follow up with her outpatient gastroenterologist, Dr. Lyndel Safe in [place]    Rectal stenosis/Anal fissure -Likely explanation for patient's low back pain and rectal pressure/pain with passage of bowel movements - Patient was placed on Anusol suppository twice a day   -Continue high-fiber diet and stool softeners at discharge    Acute blood loss anemia -Due to #1, improved, hemoglobin 10.4 at the time of discharge after 2 units of packed RBCs and remained stable    GERD (gastroesophageal reflux disease) -Continue Pepcid    Hypertension -Continue Tekturna    Varicose veins of both lower extremities -No signs of lower extremity edema    Anxiety disorder -Continue prn Xanax with sip of water  Day of Discharge BP (!) 161/65 (BP Location: Right  Arm)   Pulse 76   Temp 98.4 F (36.9 C) (Oral)   Resp 18   SpO2 98%   Physical Exam: General: Alert and awake oriented x3 not in any acute distress. HEENT: anicteric sclera, pupils reactive to light and accommodation CVS: S1-S2 clear no murmur rubs or gallops Chest: clear to auscultation bilaterally, no wheezing rales or rhonchi Abdomen: soft nontender, nondistended, normal bowel sounds Extremities: no cyanosis, clubbing or edema noted bilaterally Neuro: Cranial nerves II-XII intact, no focal neurological deficits   The results of significant diagnostics from this hospitalization (including imaging, microbiology, ancillary and laboratory) are listed below for reference.    LAB RESULTS: Basic Metabolic Panel:  Recent Labs Lab 09/05/16 1154 09/06/16 0423  NA 142 143  K 3.9 3.6  CL 113* 112*  CO2 24 22  GLUCOSE 96 95  BUN 15 9  CREATININE 0.79 0.77  CALCIUM 8.3* 8.0*  MG 1.9  --   PHOS 3.0  --    Liver Function Tests:  Recent Labs Lab 09/05/16 1154  AST 19  ALT 9*  ALKPHOS 29*  BILITOT 0.4  PROT 4.5*  ALBUMIN 2.8*   No results for input(s): LIPASE, AMYLASE in the last 168 hours. No results for input(s): AMMONIA in the last 168 hours. CBC:  Recent Labs Lab 09/05/16 1154  09/06/16 0423 09/06/16 1227  WBC 5.9  < > 5.8 7.7  NEUTROABS 3.6  --   --   --   HGB 6.8*  < > 10.2* 10.4*  HCT 21.0*  < > 30.7* 31.3*  MCV 97.2  < > 90.8 91.0  PLT 145*  < > 132* 121*  < > = values in this interval not displayed. Cardiac Enzymes: No results for input(s): CKTOTAL, CKMB, CKMBINDEX, TROPONINI in the last 168 hours. BNP: Invalid input(s): POCBNP CBG: No results for input(s): GLUCAP in the last 168 hours.  Significant Diagnostic Studies:  No results found.  2D ECHO:   Disposition and Follow-up: Discharge Instructions    Diet - low sodium heart healthy    Complete by:  As directed    Discharge instructions    Complete by:  As directed    Please stop aspirin.  Avoid constipation, Benefiber 1 tablespoon 3 times a day with meals, MiraLAX daily to avoid constipation. Follow-up with your gastroenterologist, Dr. Lyndel Safe in [place].   Increase activity slowly    Complete by:  As directed        DISPOSITION: PT evaluation recommended a short-term rehabilitation however patient refused due to private out-of-pocket pay for rehabilitation. Patient will try her daughter to stay with her 24/7 for short while   Lund    Nicoletta Dress, MD. Schedule an appointment as soon as possible for a visit in 2 week(s).   Specialty:  Internal Medicine Contact information: [address] [place] [address] [phone]        Jackquline Denmark, MD. Schedule an appointment as soon as possible for a visit in 2 week(s).   Specialty:  Specialist Contact information: [place] [address] [phone]            Time spent on Discharge: 27 minutes  Signed:   Estill Cotta M.D. [facility] 09/06/2016, 1:28 PM Pager: [phone]

## 2016-09-06 NOTE — Evaluation (Signed)
Physical Therapy Evaluation Patient Details Name: Melissa Hamilton MRN: [id number] DOB: 18-Jun-1920 Today's Date: 09/06/2016   History of Present Illness  Pt is a 83 y/o female admitted secondary to BRBPR, found to have a GI bleed likely diverticulosis. Of note, pt recently admitted to [facility] in May 2018 with hematochezia and diagnosed with diverticulosis. PMH including but not limited to GERD.  Clinical Impression  Pt presented supine in bed with HOB elevated, awake and willing to participate in therapy session. Prior to admission, pt reported that she lives alone and is independent with all functional mobility and ADLs. Pt reported that she has family and friends that can be available to assist PRN. Pt currently limited in mobility secondary to abdominal pain, fatigue and dizziness. PT recommending pt d/c to SNF for ST rehab prior to returning home alone. Pt would continue to benefit from skilled physical therapy services at this time while admitted and after d/c to address the below listed limitations in order to improve overall safety and independence with functional mobility.     Follow Up Recommendations SNF;Supervision/Assistance - 24 hour (if pt refuses will need 24/7 supervision at home for safety)    Equipment Recommendations  None recommended by PT    Recommendations for Other Services       Precautions / Restrictions Precautions Precautions: Fall Restrictions Weight Bearing Restrictions: No      Mobility  Bed Mobility Overal bed mobility: Needs Assistance Bed Mobility: Supine to Sit;Sit to Supine     Supine to sit: Supervision Sit to supine: Supervision   General bed mobility comments: increased time and effort  Transfers Overall transfer level: Needs assistance Equipment used: Rolling walker (2 wheeled) Transfers: Sit to/from Stand Sit to Stand: Supervision         General transfer comment: increased time, good technique, supervision for  safety  Ambulation/Gait Ambulation/Gait assistance: Min guard Ambulation Distance (Feet): 30 Feet Assistive device: Rolling walker (2 wheeled) Gait Pattern/deviations: Step-through pattern;Decreased step length - right;Decreased step length - left;Decreased stride length;Trunk flexed Gait velocity: decreased Gait velocity interpretation: <1.8 ft/sec, indicative of risk for recurrent falls General Gait Details: mild instability with use of RW, flexed trunk posture, min guard for safety but no overt LOB  Stairs            Wheelchair Mobility    Modified Rankin (Stroke Patients Only)       Balance Overall balance assessment: Needs assistance Sitting-balance support: No upper extremity supported Sitting balance-Leahy Scale: Fair     Standing balance support: During functional activity;No upper extremity supported Standing balance-Leahy Scale: Fair Standing balance comment: pt able to stand at sink and perform ADL task with no UE supports, min guard for safety                             Pertinent Vitals/Pain Pain Assessment: Faces Faces Pain Scale: Hurts little more Pain Location: stomach and back Pain Descriptors / Indicators: Sore Pain Intervention(s): Monitored during session;Repositioned    Home Living Family/patient expects to be discharged to:: Private residence Living Arrangements: Alone Available Help at Discharge: Family;Available PRN/intermittently Type of Home: House Home Access: Ramped entrance     Home Layout: Two level;Able to live on main level with bedroom/bathroom Home Equipment: Shower seat;Cane - single point;Walker - 2 wheels      Prior Function Level of Independence: Independent  Hand Dominance   Dominant Hand: Right    Extremity/Trunk Assessment   Upper Extremity Assessment Upper Extremity Assessment: Defer to OT evaluation    Lower Extremity Assessment Lower Extremity Assessment: Generalized  weakness    Cervical / Trunk Assessment Cervical / Trunk Assessment: Kyphotic  Communication   Communication: HOH  Cognition Arousal/Alertness: Awake/alert Behavior During Therapy: WFL for tasks assessed/performed Overall Cognitive Status: Within Functional Limits for tasks assessed                                        General Comments      Exercises     Assessment/Plan    PT Assessment Patient needs continued PT services  PT Problem List Decreased strength;Decreased activity tolerance;Decreased balance;Decreased mobility;Decreased coordination;Decreased knowledge of use of DME;Decreased safety awareness;Pain       PT Treatment Interventions DME instruction;Gait training;Stair training;Functional mobility training;Therapeutic activities;Therapeutic exercise;Balance training;Neuromuscular re-education;Patient/family education    PT Goals (Current goals can be found in the Care Plan section)  Acute Rehab PT Goals Patient Stated Goal: decrease pain PT Goal Formulation: With patient Time For Goal Achievement: 09/20/16 Potential to Achieve Goals: Good    Frequency Min 3X/week   Barriers to discharge Decreased caregiver support      Co-evaluation               AM-PAC PT "6 Clicks" Daily Activity  Outcome Measure Difficulty turning over in bed (including adjusting bedclothes, sheets and blankets)?: None Difficulty moving from lying on back to sitting on the side of the bed? : None Difficulty sitting down on and standing up from a chair with arms (e.g., wheelchair, bedside commode, etc,.)?: A Little Help needed moving to and from a bed to chair (including a wheelchair)?: A Little Help needed walking in hospital room?: A Little Help needed climbing 3-5 steps with a railing? : A Lot 6 Click Score: 19    End of Session Equipment Utilized During Treatment: Gait belt Activity Tolerance: Patient limited by pain;Other (comment) (limited secondary to  dizziness) Patient left: in bed;with call bell/phone within reach;with bed alarm set Nurse Communication: Mobility status PT Visit Diagnosis: Other abnormalities of gait and mobility (R26.89)    Time: 8921-1941 PT Time Calculation (min) (ACUTE ONLY): 32 min   Charges:   PT Evaluation $PT Eval Moderate Complexity: 1 Procedure PT Treatments $Gait Training: 8-22 mins   PT G Codes:   PT G-Codes **NOT FOR INPATIENT CLASS** Functional Assessment Tool Used: Clinical judgement;AM-PAC 6 Clicks Basic Mobility Functional Limitation: Mobility: Walking and moving around Mobility: Walking and Moving Around Current Status (D4081): At least 20 percent but less than 40 percent impaired, limited or restricted Mobility: Walking and Moving Around Goal Status [phone]): 0 percent impaired, limited or restricted    [facility], PT, DPT [place] 09/06/2016, 10:31 AM

## 2016-09-06 NOTE — Evaluation (Signed)
Occupational Therapy Evaluation and Discharge Patient Details Name: Melissa Hamilton MRN: [id number] DOB: Sep 29, 1920 Today's Date: 09/06/2016    History of Present Illness Pt is a 83 y/o female admitted secondary to BRBPR, found to have a GI bleed likely diverticulosis. Of note, pt recently admitted to [facility] in May 2018 with hematochezia and diagnosed with diverticulosis. PMH including but not limited to GERD.   Clinical Impression   PTA Pt independent in ADL and mobility. Pt currently supervision for ADL and min guard for mobility with RW. Pt states she feels better since getting her units of blood, but is just more tired than usual. OT recommending HHOT to maximize safety and independence in ADL and mobility as well as provide home safety eval to decrease fall risk. OT educated Pt on purpose of HH therapies was to get her feeling stronger, more balanced and keep her in her home as long as possible. Pt adamant that she was going home and she did NOT want therapies. She expressed this same sentiment to case management.    Follow Up Recommendations  Home health OT;Supervision - Intermittent (OT educated on why Pt would benefit from [facility], Pt refused)    Equipment Recommendations  None recommended by OT (Pt has appropriate DME)    Recommendations for Other Services       Precautions / Restrictions Precautions Precautions: Fall Restrictions Weight Bearing Restrictions: No      Mobility Bed Mobility Overal bed mobility: Needs Assistance Bed Mobility: Supine to Sit;Sit to Supine     Supine to sit: Supervision Sit to supine: Supervision   General bed mobility comments: increased time and effort, use of bed rails  Transfers Overall transfer level: Needs assistance Equipment used: Rolling walker (2 wheeled) Transfers: Sit to/from Stand Sit to Stand: Supervision         General transfer comment: increased time, good technique, supervision for safety    Balance  Overall balance assessment: Needs assistance Sitting-balance support: No upper extremity supported Sitting balance-Leahy Scale: Fair Sitting balance - Comments: Able to sit EOB for don/doff socks   Standing balance support: During functional activity;No upper extremity supported Standing balance-Leahy Scale: Fair Standing balance comment: sink level grooming                           ADL either performed or assessed with clinical judgement   ADL Overall ADL's : Needs assistance/impaired Eating/Feeding: Set up;Sitting   Grooming: Wash/dry hands;Wash/dry face;Min guard;Standing Grooming Details (indicate cue type and reason): sink level Upper Body Bathing: Supervision/ safety;Sitting   Lower Body Bathing: Supervison/ safety;Sitting/lateral leans Lower Body Bathing Details (indicate cue type and reason): educated Pt in long handle sponge Upper Body Dressing : Supervision/safety   Lower Body Dressing: Supervision/safety   Toilet Transfer: Supervision/safety;Ambulation;BSC;RW Toilet Transfer Details (indicate cue type and reason): increased time, slow pace Toileting- Clothing Manipulation and Hygiene: Supervision/safety;Sit to/from stand   Tub/ Shower Transfer: Walk-in shower;Supervision/safety;Shower Scientist, research (medical) Details (indicate cue type and reason): Pt educated on safety strategies for the shower Functional mobility during ADLs: Min guard;Rolling walker General ADL Comments: "I do all this by myself, I didn't break anything!"     Vision Patient Visual Report: No change from baseline Vision Assessment?: No apparent visual deficits     Perception     Praxis      Pertinent Vitals/Pain Pain Assessment: Faces Faces Pain Scale: Hurts little more Pain Location: stomach and back Pain Descriptors / Indicators: Sore  Pain Intervention(s): Monitored during session;Repositioned     Hand Dominance Right   Extremity/Trunk Assessment Upper Extremity  Assessment Upper Extremity Assessment: Generalized weakness   Lower Extremity Assessment Lower Extremity Assessment: Defer to PT evaluation   Cervical / Trunk Assessment Cervical / Trunk Assessment: Kyphotic   Communication Communication Communication: HOH   Cognition Arousal/Alertness: Awake/alert Behavior During Therapy: WFL for tasks assessed/performed;Agitated (determined to go home by herself without assist) Overall Cognitive Status: Within Functional Limits for tasks assessed                                     General Comments  Pt was upset at recommendation for 24 hour care/SNF placement. "I'm stubborn, I have my mind, and I don't want to go to rehab or have anyone in my home, I don't want therapy coming to my house"    Exercises     Shoulder Instructions      Home Living Family/patient expects to be discharged to:: Private residence Living Arrangements: Alone Available Help at Discharge: Family;Available PRN/intermittently;Friend(s) (good neighbors that check in and have key, also alert sys) Type of Home: House Home Access: Ramped entrance     Home Layout: Two level;Able to live on main level with bedroom/bathroom Alternate Level Stairs-Number of Steps: flight   Bathroom Shower/Tub: Occupational psychologist: Standard (but has toilet riser) Bathroom Accessibility: Yes How Accessible: Accessible via walker Home Equipment: Shower seat;Cane - single point;Walker - 2 wheels;Toilet riser;Grab bars - tub/shower          Prior Functioning/Environment Level of Independence: Independent                 OT Problem List: Decreased activity tolerance;Impaired balance (sitting and/or standing);Decreased safety awareness      OT Treatment/Interventions:      OT Goals(Current goals can be found in the care plan section) Acute Rehab OT Goals Patient Stated Goal: to go home OT Goal Formulation: With patient Time For Goal Achievement:  09/20/16 Potential to Achieve Goals: Good  OT Frequency:     Barriers to D/C:            Co-evaluation              AM-PAC PT "6 Clicks" Daily Activity     Outcome Measure Help from another person eating meals?: None Help from another person taking care of personal grooming?: None Help from another person toileting, which includes using toliet, bedpan, or urinal?: None Help from another person bathing (including washing, rinsing, drying)?: A Little Help from another person to put on and taking off regular upper body clothing?: None Help from another person to put on and taking off regular lower body clothing?: A Little 6 Click Score: 22   End of Session Equipment Utilized During Treatment: Gait belt;Rolling walker Nurse Communication: Mobility status  Activity Tolerance: Patient tolerated treatment well Patient left: in bed;with call bell/phone within reach;with bed alarm set  OT Visit Diagnosis: Unsteadiness on feet (R26.81);Muscle weakness (generalized) (M62.81)                Time: 2774-1287 OT Time Calculation (min): 21 min Charges:  OT General Charges $OT Visit: 1 Procedure OT Evaluation $OT Eval Moderate Complexity: 1 Procedure G-Codes: OT G-codes **NOT FOR INPATIENT CLASS** Functional Assessment Tool Used: AM-PAC 6 Clicks Daily Activity Functional Limitation: Self care Self Care Current Status (O6767): At least 20 percent  but less than 40 percent impaired, limited or restricted Self Care Goal Status (K4461): At least 1 percent but less than 20 percent impaired, limited or restricted Self Care Discharge Status [phone]): At least 20 percent but less than 40 percent impaired, limited or restricted   Hulda Humphrey OTR/L [place] 09/06/2016, 3:25 PM

## 2016-09-06 NOTE — Care Management Note (Addendum)
Case Management Note  Patient Details  Name: Biannca Scantlin MRN: [id number] Date of Birth: 1921/03/06  Subjective/Objective:                  Presented with BRBPR, found to have a GI bleed likely diverticulosis. From home alone.  Sandria Senter (daughter) [phone]   PCP: Nelda Bucks  Action/Plan: Per PT's eval./recommendation: SNF;Supervision/Assistance - 24 hour (if pt refuses will need 24/7 supervision at home for safety. CM spoke with pt regarding PT's recommendation for SNF placement. Current hospital stay pt placed in OBS, pt will have to privately pay for SNF placement. Pt stated she can't afford out of pocket cost. CM  called daughter and voice message left for daughter to call to discuss discharge plan for mom.  09/06/2016 @ 2: 35pm  CM received call from pt's daughter . CM explained to daughter pt will d/c today.CM also shared with pt's daughter regarding PT's eval./recommendation: SNF;Supervision/Assistance - 24 hour (if pt refuses will need 24/7 supervision at home for safety. CM explained SNF placement would be an out of pocket cost  2/2 OBS which mom has declined. CM also made daughter aware mom is refusing any home health services @ d/c. Daughter states that's mom decision, and mom has friends, neighbors that will check on her once discharge.  Daughter is providing transportation to home.  Expected Discharge Date:   09/06/2016          Expected Discharge Plan:  [place]  In-House Referral:  Clinical Social Work  Discharge planning Services  CM Consult  Post Acute Care Choice:    Choice offered to:     DME Arranged:    DME Agency:     HH Arranged:    [place] Agency:     Status of Service:  completed  If discussed at [employer], dates discussed:    Additional Comments:  Sharin Mons, RN 09/06/2016, 12:40 PM

## 2016-09-06 NOTE — Progress Notes (Signed)
Pt d/c via [place] with NT and daughter downstairs. Ranelle Oyster, RN

## 2016-09-06 NOTE — Care Management Obs Status (Signed)
[facility] NOTIFICATION   Patient Details  Name: Sopheap Basic MRN: [id number] Date of Birth: Jan 25, 1921   Medicare Observation Status Notification Given:  Yes    Sharin Mons, RN 09/06/2016, 2:08 PM

## 2016-09-06 NOTE — Progress Notes (Addendum)
          Daily Rounding Note  09/06/2016, 10:33 AM  LOS: 0 days   SUBJECTIVE:   Chief complaint: hematochezia.  Brown stool mixed with about 20 to 30 ml of red blood this AM.  Feels a bit weak, dizzy when she gets up.  Tolerating regualr diet.      OBJECTIVE:         Vital signs in last 24 hours:    Temp:  [98 F (36.7 C)-99.5 F (37.5 C)] 98.4 F (36.9 C) (06/06 0517) Pulse Rate:  [76-86] 76 (06/06 0517) Resp:  [14-18] 18 (06/06 0517) BP: (140-182)/(41-68) 161/65 (06/06 0611) SpO2:  [96 %-100 %] 98 % (06/06 0517) Last BM Date: 09/05/16 There were no vitals filed for this visit. General: anxious, HOH.  Looks good for age.  Not ill looking   Heart: RRR Chest: clear bil.  No labored breathing or cough Abdomen: soft, NT, ND.  Active BS  Extremities: no CCE Neuro/Psych:  Oriented x 3. Anxious, pressured speach  Intake/Output from previous day: 06/05 0701 - 06/06 0700 In: 692.5 [Blood:692.5] Out: 900 [Urine:900]  Intake/Output this shift: No intake/output data recorded.  Lab Results:  Recent Labs  09/05/16 1154 09/06/16 0033 09/06/16 0423  WBC 5.9 6.8 5.8  HGB 6.8* 10.2* 10.2*  HCT 21.0* 31.2* 30.7*  PLT 145* 128* 132*   BMET  Recent Labs  09/05/16 1154 09/06/16 0423  NA 142 143  K 3.9 3.6  CL 113* 112*  CO2 24 22  GLUCOSE 96 95  BUN 15 9  CREATININE 0.79 0.77  CALCIUM 8.3* 8.0*   LFT  Recent Labs  09/05/16 1154  PROT 4.5*  ALBUMIN 2.8*  AST 19  ALT 9*  ALKPHOS 29*  BILITOT 0.4   PT/INR No results for input(s): LABPROT, INR in the last 72 hours. Hepatitis Panel No results for input(s): HEPBSAG, HCVAB, HEPAIGM, HEPBIGM in the last 72 hours.  Studies/Results: No results found.   Scheduled Meds: . hydrocortisone  25 mg Rectal BID  . senna-docusate  1 tablet Oral BID   Continuous Infusions: . sodium chloride 75 mL/hr at 09/06/16 0524  . famotidine (PEPCID) IV Stopped (09/05/16 1121)    PRN Meds:.acetaminophen **OR** acetaminophen, ALPRAZolam, hydrALAZINE, ondansetron **OR** ondansetron (ZOFRAN) IV, polyethylene glycol, traMADol  ASSESMENT:   *  GI bleed.  Hematochezia.  Presumed diverticular bleed.   Colonoscopy 1 montha ago for hematochezia: diverticulosis, minor rectal stenosis, non-bleeding anal fissure.   *  Blood loss anemia.  Hgb better after PRBC x 2  *  Thrombocytopenia.   *  Anxiety.     PLAN   *  Supportive care.  Stop IV Pepcid, resume usual po Pepcid.  Repeat CBC this afternoon.  Continue regular diet.   Bleeding this AM not enough to warrant RBC tagged scan.    Melissa Hamilton  09/06/2016, 10:33 AM Pager: [phone]   Attending physician's note   I have taken an interval history, reviewed the chart and examined the patient. I agree with the Advanced Practitioner's note, impression and recommendations.  Patient is likely passing residual blood in the colon. Hemoglobin stable status post transfusion. Continue regular diet. Anal fissure: Benefiber 1 tablespoon with meals 3 times daily and titrate MiraLAX one capful daily to have regular bowel movement.  Okay to discharge home if no further bleeding.  Follow up with outpatient GI  K Denzil Magnuson, MD [phone] Mon-Fri 8a-5p [phone] after 5p, weekends, holidays

## 2016-09-06 NOTE — Progress Notes (Addendum)
Nsg Discharge Note  Admit Date:  09/05/2016 Discharge date: 09/06/2016   Melissa Hamilton to be D/C'd Home per MD order.  AVS completed.  Copy for chart, and copy for patient signed, and dated. Patient/caregiver able to verbalize understanding.  Discharge Medication: Allergies as of 09/06/2016   No Known Allergies     Medication List    STOP taking these medications   bisacodyl 10 MG suppository Commonly known as:  DULCOLAX   docusate sodium 100 MG capsule Commonly known as:  COLACE   potassium chloride 10 MEQ tablet Commonly known as:  K-DUR,KLOR-CON     TAKE these medications   aliskiren 150 MG tablet Commonly known as:  TEKTURNA Take 150 mg by mouth at bedtime.   ALPRAZolam 0.25 MG tablet Commonly known as:  XANAX Take 0.25 mg by mouth every 8 (eight) hours as needed for anxiety.   BENEFIBER Powd Benefiber 1 tablespoon with meals 3 times daily   Biotin 5 MG Caps Take 5 mg by mouth daily.   CALTRATE 600+D PLUS PO Take 1 tablet by mouth 2 (two) times daily.   famotidine 20 MG tablet Commonly known as:  PEPCID Take 20 mg by mouth 2 (two) times daily.   Fish Oil 1000 MG Caps Take 1,000 mg by mouth 2 (two) times daily.   GLUCOSAMINE-CHONDROITIN PO Take 1 capsule by mouth daily.   hydrocortisone 25 MG suppository Commonly known as:  ANUSOL-HC Place 1 suppository (25 mg total) rectally 2 (two) times daily.   magnesium gluconate 500 MG tablet Commonly known as:  MAGONATE Take 500 mg by mouth daily.   MULTIVITAMIN PO Take 1 tablet by mouth daily.   polyethylene glycol packet Commonly known as:  MIRALAX / GLYCOLAX Take 17 g by mouth daily. What changed:  when to take this  reasons to take this   risedronate 150 MG tablet Commonly known as:  ACTONEL Take 150 mg by mouth every 30 (thirty) days. with water on empty stomach, nothing by mouth or lie down for next 30 minutes.   senna-docusate 8.6-50 MG tablet Commonly known as:  Senokot-S Take 1 tablet by  mouth at bedtime as needed for mild constipation or moderate constipation.   traMADol 50 MG tablet Commonly known as:  ULTRAM Take 50 mg by mouth 4 (four) times daily as needed for moderate pain.   triamcinolone cream 0.1 % Commonly known as:  KENALOG Apply 1 application topically 2 (two) times daily as needed (for skin irritation).   vitamin B-12 1000 MCG tablet Commonly known as:  CYANOCOBALAMIN Take 1,000 mcg by mouth daily.   vitamin C 500 MG tablet Commonly known as:  ASCORBIC ACID Take 500 mg by mouth daily.       Discharge Assessment: Vitals:   09/06/16 0611 09/06/16 1410  BP: (!) 161/65 (!) 121/45  Pulse:  87  Resp:  18  Temp:  98.1 F (36.7 C)   Skin clean, dry and intact without evidence of skin break down, no evidence of skin tears noted. IV catheter discontinued intact. Site without signs and symptoms of complications - no redness or edema noted at insertion site, patient denies c/o pain - only slight tenderness at site.  Dressing with slight pressure applied.  D/c Instructions-Education: Discharge instructions given to patient/family with verbalized understanding. D/c education completed with patient/family including follow up instructions, medication list, d/c activities limitations if indicated, with other d/c instructions as indicated by MD - patient able to verbalize understanding, all questions fully answered. Patient  instructed to return to ED, call 911, or call MD for any changes in condition. Daughter to pick up patient between 7-7:30pm Patient escorted via [facility], and D/C home via private auto.  Salley Slaughter, RN 09/06/2016 5:28 PM

## 2016-09-19 DIAGNOSIS — E559 Vitamin D deficiency, unspecified: Secondary | ICD-10-CM | POA: Diagnosis not present

## 2016-09-19 DIAGNOSIS — K5731 Diverticulosis of large intestine without perforation or abscess with bleeding: Secondary | ICD-10-CM | POA: Diagnosis not present

## 2016-09-19 DIAGNOSIS — Z6821 Body mass index (BMI) 21.0-21.9, adult: Secondary | ICD-10-CM | POA: Diagnosis not present

## 2016-09-19 DIAGNOSIS — M810 Age-related osteoporosis without current pathological fracture: Secondary | ICD-10-CM | POA: Diagnosis not present

## 2016-09-19 DIAGNOSIS — K922 Gastrointestinal hemorrhage, unspecified: Secondary | ICD-10-CM | POA: Diagnosis not present

## 2016-09-19 DIAGNOSIS — E538 Deficiency of other specified B group vitamins: Secondary | ICD-10-CM | POA: Diagnosis not present

## 2016-09-19 DIAGNOSIS — I10 Essential (primary) hypertension: Secondary | ICD-10-CM | POA: Diagnosis not present

## 2016-09-19 DIAGNOSIS — D62 Acute posthemorrhagic anemia: Secondary | ICD-10-CM | POA: Diagnosis not present

## 2016-11-29 DIAGNOSIS — R1013 Epigastric pain: Secondary | ICD-10-CM | POA: Diagnosis not present

## 2016-11-29 DIAGNOSIS — Z8719 Personal history of other diseases of the digestive system: Secondary | ICD-10-CM | POA: Diagnosis not present

## 2016-11-29 DIAGNOSIS — R634 Abnormal weight loss: Secondary | ICD-10-CM | POA: Diagnosis not present

## 2017-02-05 DIAGNOSIS — D62 Acute posthemorrhagic anemia: Secondary | ICD-10-CM | POA: Diagnosis not present

## 2017-02-05 DIAGNOSIS — K219 Gastro-esophageal reflux disease without esophagitis: Secondary | ICD-10-CM | POA: Diagnosis not present

## 2017-02-05 DIAGNOSIS — S32050A Wedge compression fracture of fifth lumbar vertebra, initial encounter for closed fracture: Secondary | ICD-10-CM | POA: Diagnosis not present

## 2017-02-05 DIAGNOSIS — Z23 Encounter for immunization: Secondary | ICD-10-CM | POA: Diagnosis not present

## 2017-02-05 DIAGNOSIS — Z6820 Body mass index (BMI) 20.0-20.9, adult: Secondary | ICD-10-CM | POA: Diagnosis not present

## 2017-02-05 DIAGNOSIS — E559 Vitamin D deficiency, unspecified: Secondary | ICD-10-CM | POA: Diagnosis not present

## 2017-02-05 DIAGNOSIS — I10 Essential (primary) hypertension: Secondary | ICD-10-CM | POA: Diagnosis not present

## 2017-02-05 DIAGNOSIS — N3281 Overactive bladder: Secondary | ICD-10-CM | POA: Diagnosis not present

## 2017-02-05 DIAGNOSIS — S22089A Unspecified fracture of T11-T12 vertebra, initial encounter for closed fracture: Secondary | ICD-10-CM | POA: Diagnosis not present

## 2017-02-05 DIAGNOSIS — M810 Age-related osteoporosis without current pathological fracture: Secondary | ICD-10-CM | POA: Diagnosis not present

## 2017-04-09 DIAGNOSIS — D3131 Benign neoplasm of right choroid: Secondary | ICD-10-CM | POA: Diagnosis not present

## 2017-08-07 DIAGNOSIS — I10 Essential (primary) hypertension: Secondary | ICD-10-CM | POA: Diagnosis not present

## 2017-08-07 DIAGNOSIS — S32050A Wedge compression fracture of fifth lumbar vertebra, initial encounter for closed fracture: Secondary | ICD-10-CM | POA: Diagnosis not present

## 2017-08-07 DIAGNOSIS — D62 Acute posthemorrhagic anemia: Secondary | ICD-10-CM | POA: Diagnosis not present

## 2017-08-07 DIAGNOSIS — M810 Age-related osteoporosis without current pathological fracture: Secondary | ICD-10-CM | POA: Diagnosis not present

## 2017-08-07 DIAGNOSIS — E559 Vitamin D deficiency, unspecified: Secondary | ICD-10-CM | POA: Diagnosis not present

## 2018-02-11 DIAGNOSIS — Z9181 History of falling: Secondary | ICD-10-CM | POA: Diagnosis not present

## 2018-02-11 DIAGNOSIS — E559 Vitamin D deficiency, unspecified: Secondary | ICD-10-CM | POA: Diagnosis not present

## 2018-02-11 DIAGNOSIS — S32050A Wedge compression fracture of fifth lumbar vertebra, initial encounter for closed fracture: Secondary | ICD-10-CM | POA: Diagnosis not present

## 2018-02-11 DIAGNOSIS — Z23 Encounter for immunization: Secondary | ICD-10-CM | POA: Diagnosis not present

## 2018-02-11 DIAGNOSIS — I10 Essential (primary) hypertension: Secondary | ICD-10-CM | POA: Diagnosis not present

## 2018-02-11 DIAGNOSIS — D62 Acute posthemorrhagic anemia: Secondary | ICD-10-CM | POA: Diagnosis not present

## 2018-02-11 DIAGNOSIS — M810 Age-related osteoporosis without current pathological fracture: Secondary | ICD-10-CM | POA: Diagnosis not present

## 2018-03-17 ENCOUNTER — Observation Stay (HOSPITAL_COMMUNITY)
Admission: EM | Admit: 2018-03-17 | Discharge: 2018-03-18 | Disposition: A | Discharge status: Home / Self Care | Payer: Medicare Other | Attending: Family Medicine | Admitting: Family Medicine

## 2018-03-17 ENCOUNTER — Encounter (HOSPITAL_COMMUNITY): Payer: Self-pay | Admitting: *Deleted

## 2018-03-17 ENCOUNTER — Other Ambulatory Visit: Payer: Self-pay

## 2018-03-17 DIAGNOSIS — K219 Gastro-esophageal reflux disease without esophagitis: Secondary | ICD-10-CM | POA: Diagnosis not present

## 2018-03-17 DIAGNOSIS — K921 Melena: Secondary | ICD-10-CM

## 2018-03-17 DIAGNOSIS — F419 Anxiety disorder, unspecified: Secondary | ICD-10-CM | POA: Insufficient documentation

## 2018-03-17 DIAGNOSIS — Z8719 Personal history of other diseases of the digestive system: Secondary | ICD-10-CM

## 2018-03-17 DIAGNOSIS — G8929 Other chronic pain: Secondary | ICD-10-CM | POA: Diagnosis not present

## 2018-03-17 DIAGNOSIS — K5731 Diverticulosis of large intestine without perforation or abscess with bleeding: Secondary | ICD-10-CM | POA: Diagnosis present

## 2018-03-17 DIAGNOSIS — K5900 Constipation, unspecified: Secondary | ICD-10-CM | POA: Diagnosis not present

## 2018-03-17 DIAGNOSIS — E785 Hyperlipidemia, unspecified: Secondary | ICD-10-CM | POA: Diagnosis not present

## 2018-03-17 DIAGNOSIS — I10 Essential (primary) hypertension: Secondary | ICD-10-CM | POA: Diagnosis present

## 2018-03-17 DIAGNOSIS — M810 Age-related osteoporosis without current pathological fracture: Secondary | ICD-10-CM | POA: Diagnosis not present

## 2018-03-17 DIAGNOSIS — N184 Chronic kidney disease, stage 4 (severe): Secondary | ICD-10-CM | POA: Diagnosis not present

## 2018-03-17 DIAGNOSIS — K5790 Diverticulosis of intestine, part unspecified, without perforation or abscess without bleeding: Principal | ICD-10-CM | POA: Insufficient documentation

## 2018-03-17 DIAGNOSIS — R011 Cardiac murmur, unspecified: Secondary | ICD-10-CM | POA: Diagnosis present

## 2018-03-17 DIAGNOSIS — D62 Acute posthemorrhagic anemia: Secondary | ICD-10-CM

## 2018-03-17 DIAGNOSIS — M545 Low back pain: Secondary | ICD-10-CM | POA: Insufficient documentation

## 2018-03-17 DIAGNOSIS — R58 Hemorrhage, not elsewhere classified: Secondary | ICD-10-CM | POA: Diagnosis not present

## 2018-03-17 DIAGNOSIS — I129 Hypertensive chronic kidney disease with stage 1 through stage 4 chronic kidney disease, or unspecified chronic kidney disease: Secondary | ICD-10-CM | POA: Diagnosis not present

## 2018-03-17 DIAGNOSIS — Z66 Do not resuscitate: Secondary | ICD-10-CM | POA: Diagnosis not present

## 2018-03-17 DIAGNOSIS — K625 Hemorrhage of anus and rectum: Secondary | ICD-10-CM | POA: Diagnosis present

## 2018-03-17 DIAGNOSIS — R197 Diarrhea, unspecified: Secondary | ICD-10-CM | POA: Diagnosis not present

## 2018-03-17 HISTORY — DX: Chronic kidney disease, stage 4 (severe): N18.4

## 2018-03-17 HISTORY — DX: Asymptomatic varicose veins of bilateral lower extremities: I83.93

## 2018-03-17 HISTORY — DX: Stenosis of anus and rectum: K62.4

## 2018-03-17 HISTORY — DX: Acute posthemorrhagic anemia: D62

## 2018-03-17 HISTORY — DX: Anal fissure, unspecified: K60.2

## 2018-03-17 HISTORY — DX: Hemorrhage of anus and rectum: K62.5

## 2018-03-17 HISTORY — DX: Anxiety disorder, unspecified: F41.9

## 2018-03-17 HISTORY — DX: Personal history of other diseases of the digestive system: Z87.19

## 2018-03-17 HISTORY — DX: Age-related osteoporosis without current pathological fracture: M81.0

## 2018-03-17 LAB — CBC
HCT: 35.2 % — ABNORMAL LOW (ref 36.0–46.0)
HCT: 32.7 % — ABNORMAL LOW (ref 36.0–46.0)
HEMATOCRIT: 36.5 % (ref 36.0–46.0)
HEMOGLOBIN: 11.4 g/dL — AB (ref 12.0–15.0)
Hemoglobin: 10.7 g/dL — ABNORMAL LOW (ref 12.0–15.0)
Hemoglobin: 10.5 g/dL — ABNORMAL LOW (ref 12.0–15.0)
MCH: 30.7 pg (ref 26.0–34.0)
MCH: 29.8 pg (ref 26.0–34.0)
MCH: 30.9 pg (ref 26.0–34.0)
MCHC: 31.2 g/dL (ref 30.0–36.0)
MCHC: 30.4 g/dL (ref 30.0–36.0)
MCHC: 32.1 g/dL (ref 30.0–36.0)
MCV: 98.4 fL (ref 80.0–100.0)
MCV: 98.1 fL (ref 80.0–100.0)
MCV: 96.2 fL (ref 80.0–100.0)
PLATELETS: 202 10*3/uL (ref 150–400)
Platelets: 191 10*3/uL (ref 150–400)
Platelets: 186 10*3/uL (ref 150–400)
RBC: 3.71 MIL/uL — AB (ref 3.87–5.11)
RBC: 3.59 MIL/uL — ABNORMAL LOW (ref 3.87–5.11)
RBC: 3.4 MIL/uL — ABNORMAL LOW (ref 3.87–5.11)
RDW: 12.5 % (ref 11.5–15.5)
RDW: 12.4 % (ref 11.5–15.5)
RDW: 12.3 % (ref 11.5–15.5)
WBC: 6.1 10*3/uL (ref 4.0–10.5)
WBC: 6.9 10*3/uL (ref 4.0–10.5)
WBC: 7.1 10*3/uL (ref 4.0–10.5)
nRBC: 0 % (ref 0.0–0.2)
nRBC: 0 % (ref 0.0–0.2)
nRBC: 0 % (ref 0.0–0.2)

## 2018-03-17 LAB — COMPREHENSIVE METABOLIC PANEL
ALBUMIN: 3.6 g/dL (ref 3.5–5.0)
ALT: 10 U/L (ref 0–44)
ANION GAP: 14 (ref 5–15)
AST: 21 U/L (ref 15–41)
Alkaline Phosphatase: 36 U/L — ABNORMAL LOW (ref 38–126)
BUN: 25 mg/dL — ABNORMAL HIGH (ref 8–23)
CHLORIDE: 103 mmol/L (ref 98–111)
CO2: 23 mmol/L (ref 22–32)
Calcium: 9 mg/dL (ref 8.9–10.3)
Creatinine, Ser: 0.99 mg/dL (ref 0.44–1.00)
GFR calc non Af Amer: 48 mL/min — ABNORMAL LOW (ref >60)
GFR, EST AFRICAN AMERICAN: 56 mL/min — AB (ref >60)
GLUCOSE: 87 mg/dL (ref 70–99)
Potassium: 4.2 mmol/L (ref 3.5–5.1)
Sodium: 140 mmol/L (ref 135–145)
Total Bilirubin: 0.8 mg/dL (ref 0.3–1.2)
Total Protein: 6.2 g/dL — ABNORMAL LOW (ref 6.5–8.1)

## 2018-03-17 LAB — LIPASE, BLOOD: LIPASE: 33 U/L (ref 11–51)

## 2018-03-17 LAB — TYPE AND SCREEN
ABO/RH(D): O POS
ANTIBODY SCREEN: NEGATIVE

## 2018-03-17 LAB — POC OCCULT BLOOD, ED: Fecal Occult Bld: POSITIVE — AB

## 2018-03-17 MED ORDER — SENNOSIDES-DOCUSATE SODIUM 8.6-50 MG PO TABS: 1.0000 | ORAL_TABLET | Freq: Every evening | ORAL | Status: DC | PRN

## 2018-03-17 MED ORDER — FAMOTIDINE IN NACL 20-0.9 MG/50ML-% IV SOLN
20.0000 mg | INTRAVENOUS | Status: AC
  Administered 2018-03-17: 20 mg via INTRAVENOUS
  Filled 2018-03-17: qty 50

## 2018-03-17 MED ORDER — ALPRAZOLAM 0.25 MG PO TABS
0.2500 mg | ORAL_TABLET | Freq: Three times a day (TID) | ORAL | Status: DC | PRN
  Administered 2018-03-17: 0.25 mg via ORAL
  Filled 2018-03-17: qty 1

## 2018-03-17 MED ORDER — TRAMADOL HCL 50 MG PO TABS: 50.0000 mg | ORAL_TABLET | Freq: Four times a day (QID) | ORAL | Status: DC | PRN

## 2018-03-17 MED ORDER — POLYETHYLENE GLYCOL 3350 17 G PO PACK: 17.0000 g | PACK | Freq: Every day | ORAL | Status: DC

## 2018-03-17 MED ORDER — PANTOPRAZOLE SODIUM 40 MG IV SOLR
40.0000 mg | Freq: Once | INTRAVENOUS | Status: AC
  Administered 2018-03-17: 40 mg via INTRAVENOUS
  Filled 2018-03-17: qty 40

## 2018-03-17 MED ORDER — VITAMIN B-12 1000 MCG PO TABS
1000.0000 ug | ORAL_TABLET | Freq: Every day | ORAL | Status: DC
  Administered 2018-03-17 – 2018-03-18 (×2): 1000 ug via ORAL
  Filled 2018-03-17 (×2): qty 1

## 2018-03-17 MED ORDER — POTASSIUM CHLORIDE CRYS ER 10 MEQ PO TBCR: 10.0000 meq | EXTENDED_RELEASE_TABLET | Freq: Two times a day (BID) | ORAL | Status: DC

## 2018-03-17 NOTE — Consult Note (Addendum)
Referring Provider: [facility]  Primary Care Physician:  Nicoletta Dress, MD Primary Gastroenterologist: Clarnce Flock Dr. Lyndel Safe in [place] in 2018, saw  Dr. Juliann Mule in hospital here in 2018. Daughter wants patient to see Dr. Henrene Pastor if ever needs to be seen in office ( daughter sees Henrene Pastor)   Reason for Consultation:   GI bleed    ASSESSMENT  / PLAN:   84 yo female with painless hematochezia, likely a recurrent diverticular hemorrhage. She is hemodynamically stable, hgb above baseline ( for now).  -Hopefully bleeding will resolve spontaneously. Support in interim with IVF, blood transfusion if needed.  -Q6 hour CBC -clear liquids   HPI: Melissa Hamilton is a 84 y.o. female with HTN, diverticulosis, hard of hearing a history of recurrent diverticular bleeding early May 2018 [facility]) and again in early June 2018 [facility]). Colonoscopy in May during [address] admission revealed moderate to severe diverticulosis, rectal stenosis and a small posterior anal fissure. Patient had recurrent bleeding, hospitalized her in early June 2018. Both times she required blood transfusion.  No bleeding since June 2018 admission.   Friday night patient had episode of diarrhea with blood. She had another episode yesterday and a 3rd one this am.   ED eval:  Hemodynamically stable.  Hgb 11.4 (10.4 in June 2018) WBC 6.1 Platelets 191 BUN 25 Liver tests normal.    Bleeding is painless without abdominal or rectal pain. No N/V. She doesn't feel lightheaded / SOB. Patient takes no NSAIDs / blood thinners. Per daughter [name], patient's bowels often alternate between constipation / diarrhea. She takes MIralax as needed    Past Medical History:  Diagnosis Date  . GERD (gastroesophageal reflux disease)   . GI bleed   . HOH (hard of hearing)   . Hypertension     Past Surgical History:  Procedure Laterality Date  . COLONOSCOPY    . HEMORROIDECTOMY    . TONSILLECTOMY      Prior to  Admission medications   Medication Sig Start Date End Date Taking? Authorizing Provider  aliskiren (TEKTURNA) 150 MG tablet Take 150 mg by mouth at bedtime.    [provider]  ALPRAZolam Duanne Moron) 0.25 MG tablet Take 0.25 mg by mouth every 8 (eight) hours as needed for anxiety.    [provider]  Biotin 5 MG CAPS Take 5 mg by mouth daily.    [provider]  Calcium Carbonate-Vit D-Min (CALTRATE 600+D PLUS PO) Take 1 tablet by mouth 2 (two) times daily.    [provider]  famotidine (PEPCID) 20 MG tablet Take 20 mg by mouth 2 (two) times daily.    [provider]  GLUCOSAMINE-CHONDROITIN PO Take 1 capsule by mouth daily.    [provider]  hydrocortisone (ANUSOL-HC) 25 MG suppository Place 1 suppository (25 mg total) rectally 2 (two) times daily. 09/06/16   Rai, Ripudeep K, MD  magnesium gluconate (MAGONATE) 500 MG tablet Take 500 mg by mouth daily.    [provider]  Multiple Vitamins-Minerals (MULTIVITAMIN PO) Take 1 tablet by mouth daily.    [provider]  Omega-3 Fatty Acids (FISH OIL) 1000 MG CAPS Take 1,000 mg by mouth 2 (two) times daily.    [provider]  polyethylene glycol (MIRALAX / GLYCOLAX) packet Take 17 g by mouth daily. 09/06/16   Rai, Vernelle Emerald, MD  risedronate (ACTONEL) 150 MG tablet Take 150 mg by mouth every 30 (thirty) days. with water on empty stomach, nothing by mouth or lie down  for next 30 minutes.    [provider]  senna-docusate (SENOKOT-S) 8.6-50 MG tablet Take 1 tablet by mouth at bedtime as needed for mild constipation or moderate constipation. 09/06/16   Rai, Vernelle Emerald, MD  traMADol (ULTRAM) 50 MG tablet Take 50 mg by mouth 4 (four) times daily as needed for moderate pain.    [provider]  triamcinolone cream (KENALOG) 0.1 % Apply 1 application topically 2 (two) times daily as needed (for skin irritation).    [provider]  vitamin B-12 (CYANOCOBALAMIN)  1000 MCG tablet Take 1,000 mcg by mouth daily.    [provider]  vitamin C (ASCORBIC ACID) 500 MG tablet Take 500 mg by mouth daily.    [provider]  Wheat Dextrin (BENEFIBER) POWD Benefiber 1 tablespoon with meals 3 times daily 09/06/16   Rai, Ripudeep K, MD    No current facility-administered medications for this encounter.    Current Outpatient Medications  Medication Sig Dispense Refill  . aliskiren (TEKTURNA) 150 MG tablet Take 150 mg by mouth at bedtime.    . ALPRAZolam (XANAX) 0.25 MG tablet Take 0.25 mg by mouth every 8 (eight) hours as needed for anxiety.    . Biotin 5 MG CAPS Take 5 mg by mouth daily.    . Calcium Carbonate-Vit D-Min (CALTRATE 600+D PLUS PO) Take 1 tablet by mouth 2 (two) times daily.    . famotidine (PEPCID) 20 MG tablet Take 20 mg by mouth 2 (two) times daily.    Marland Kitchen GLUCOSAMINE-CHONDROITIN PO Take 1 capsule by mouth daily.    . hydrocortisone (ANUSOL-HC) 25 MG suppository Place 1 suppository (25 mg total) rectally 2 (two) times daily. 100 suppository 2  . magnesium gluconate (MAGONATE) 500 MG tablet Take 500 mg by mouth daily.    . Multiple Vitamins-Minerals (MULTIVITAMIN PO) Take 1 tablet by mouth daily.    . Omega-3 Fatty Acids (FISH OIL) 1000 MG CAPS Take 1,000 mg by mouth 2 (two) times daily.    . polyethylene glycol (MIRALAX / GLYCOLAX) packet Take 17 g by mouth daily. 30 each 1  . risedronate (ACTONEL) 150 MG tablet Take 150 mg by mouth every 30 (thirty) days. with water on empty stomach, nothing by mouth or lie down for next 30 minutes.    . senna-docusate (SENOKOT-S) 8.6-50 MG tablet Take 1 tablet by mouth at bedtime as needed for mild constipation or moderate constipation. 60 tablet 3  . traMADol (ULTRAM) 50 MG tablet Take 50 mg by mouth 4 (four) times daily as needed for moderate pain.    Marland Kitchen triamcinolone cream (KENALOG) 0.1 % Apply 1 application topically 2 (two) times daily as needed (for skin irritation).    . vitamin B-12  (CYANOCOBALAMIN) 1000 MCG tablet Take 1,000 mcg by mouth daily.    . vitamin C (ASCORBIC ACID) 500 MG tablet Take 500 mg by mouth daily.    . Wheat Dextrin (BENEFIBER) POWD Benefiber 1 tablespoon with meals 3 times daily 245 g 3    Allergies as of 03/17/2018  . (No Known Allergies)    No family history on file.  Social History   Socioeconomic History  . Marital status: Single    Spouse name: Not on file  . Number of children: Not on file  . Years of education: Not on file  . Highest education level: Not on file  Occupational History  . Not on file  Social Needs  . Financial resource strain: Not on file  .  Food insecurity:    Worry: Not on file    Inability: Not on file  . Transportation needs:    Medical: Not on file    Non-medical: Not on file  Tobacco Use  . Smoking status: Never Smoker  . Smokeless tobacco: Never Used  Substance and Sexual Activity  . Alcohol use: No  . Drug use: No  . Sexual activity: Not on file  Lifestyle  . Physical activity:    Days per week: Not on file    Minutes per session: Not on file  . Stress: Not on file  Relationships  . Social connections:    Talks on phone: Not on file    Gets together: Not on file    Attends religious service: Not on file    Active member of club or organization: Not on file    Attends meetings of clubs or organizations: Not on file    Relationship status: Not on file  . Intimate partner violence:    Fear of current or ex partner: Not on file    Emotionally abused: Not on file    Physically abused: Not on file    Forced sexual activity: Not on file  Other Topics Concern  . Not on file  Social History Narrative  . Not on file    Review of Systems: Chronic right wrist pain All systems reviewed and negative except where noted in HPI.  Physical Exam: Vital signs in last 24 hours: Temp:  [98.6 F (37 C)] 98.6 F (37 C) (12/15 1044) Pulse Rate:  [76-85] 85 (12/15 1315) Resp:  [11-22] 19 (12/15  1315) BP: (139-182)/(64-102) 169/68 (12/15 1315) SpO2:  [96 %-100 %] 97 % (12/15 1315) Weight:  [49.9 kg] 49.9 kg (12/15 1045)   General:   Alert, small framed female in NAD Psych:  Pleasant, cooperative. Normal mood and affect. Eyes:  Pupils equal, sclera clear, no icterus.   Conjunctiva pink. Ears:  HOH. Nose:  No deformity, discharge,  or lesions. Neck:  Supple; no masses Lungs:  Clear throughout to auscultation.   No wheezes, crackles, or rhonchi.  Heart:  Regular rate and rhythm, soft murmur, no lower extremity edema Abdomen:  Soft, non-distended, nontender, BS active, no palp mass    Rectal:  Perianal tag, scant amount of thin blood tinged fluid in vault. Non-tender on DRE.  Msk:  Symmetrical without gross deformities. . No swelling or tenderness right forearm/wrist Neurologic:  Alert and  oriented x4;  grossly normal neurologically. Skin:  Intact without significant lesions or rashes.   Intake/Output from previous day: No intake/output data recorded. Intake/Output this shift: No intake/output data recorded.  Lab Results: Recent Labs    03/17/18 1055  WBC 6.1  HGB 11.4*  HCT 36.5  PLT 191   BMET Recent Labs    03/17/18 1055  NA 140  K 4.2  CL 103  CO2 23  GLUCOSE 87  BUN 25*  CREATININE 0.99  CALCIUM 9.0   LFT Recent Labs    03/17/18 1055  PROT 6.2*  ALBUMIN 3.6  AST 21  ALT 10  ALKPHOS 36*  BILITOT 0.8     Tye Savoy, NP-C @  03/17/2018, 2:18 PM   I have reviewed the entire case in detail with the above APP and discussed the plan in detail.  Therefore, I agree with the diagnoses recorded above. In addition,  I have personally interviewed and examined the patient and have personally reviewed any abdominal/pelvic CT scan images.  My additional thoughts are as follows:  Acute set hematochezia with acute blood loss anemia reminiscent of her previous diverticular bleeding episodes.  This is most likely recurrent diverticular bleeding.   Hopefully, given her age and AVR previous episodes, we can image this conservatively with supportive care, serial hematocrits and transfusion if hemoglobin critical. No current plans for colonoscopy or imaging.  Plan discussed with patient, family and family practice admission team.   Nelida Meuse III Office:[phone]

## 2018-03-17 NOTE — ED Triage Notes (Signed)
States her abd. Started rumbling last night went to the bathroom thought she was going to have diarrhea states she had diarrhea with bright red blood , this am had another loose stool this am and it was dark. Denies pain

## 2018-03-17 NOTE — ED Provider Notes (Addendum)
[facility] EMERGENCY DEPARTMENT Provider Note   CSN: [id number] Arrival date & time: 03/17/18  1035     History   Chief Complaint Chief Complaint  Patient presents with  . Diarrhea    HPI Mamta Rimmer is a 84 y.o. female.  HPI  Diarrhea started last night - with some BRB - has had multiple episodes since - but now is darker in color - has some epigastric pain as well - has diverticulosis - and hemorrhoids - intermittnet bleeding - had no fever chills nausea or vomiting.  Sx are moderate to severe.  Past Medical History:  Diagnosis Date  . GERD (gastroesophageal reflux disease)   . GI bleed   . HOH (hard of hearing)   . Hypertension     Patient Active Problem List   Diagnosis Date Noted  . Rectal bleeding 09/05/2016  . Diverticulosis 09/05/2016  . GERD (gastroesophageal reflux disease) 09/05/2016  . Hypertension 09/05/2016  . Varicose veins of both lower extremities 09/05/2016  . Anxiety disorder 09/05/2016  . Rectal stenosis 09/05/2016  . Anal fissure 09/05/2016  . Bright red rectal bleeding 09/05/2016  . Acute blood loss anemia 09/05/2016    Past Surgical History:  Procedure Laterality Date  . COLONOSCOPY    . HEMORROIDECTOMY    . TONSILLECTOMY       OB History   No obstetric history on file.      Home Medications    Prior to Admission medications   Medication Sig Start Date End Date Taking? Authorizing Provider  aliskiren (TEKTURNA) 150 MG tablet Take 150 mg by mouth at bedtime.    [provider]  ALPRAZolam Duanne Moron) 0.25 MG tablet Take 0.25 mg by mouth every 8 (eight) hours as needed for anxiety.    [provider]  Biotin 5 MG CAPS Take 5 mg by mouth daily.    [provider]  Calcium Carbonate-Vit D-Min (CALTRATE 600+D PLUS PO) Take 1 tablet by mouth 2 (two) times daily.    [provider]  famotidine (PEPCID) 20 MG tablet Take 20 mg by mouth 2 (two) times daily.    [provider]  GLUCOSAMINE-CHONDROITIN PO Take 1 capsule by mouth daily.    [provider]  hydrocortisone (ANUSOL-HC) 25 MG suppository Place 1 suppository (25 mg total) rectally 2 (two) times daily. 09/06/16   Rai, Ripudeep K, MD  magnesium gluconate (MAGONATE) 500 MG tablet Take 500 mg by mouth daily.    [provider]  Multiple Vitamins-Minerals (MULTIVITAMIN PO) Take 1 tablet by mouth daily.    [provider]  Omega-3 Fatty Acids (FISH OIL) 1000 MG CAPS Take 1,000 mg by mouth 2 (two) times daily.    [provider]  polyethylene glycol (MIRALAX / GLYCOLAX) packet Take 17 g by mouth daily. 09/06/16   Rai, Vernelle Emerald, MD  risedronate (ACTONEL) 150 MG tablet Take 150 mg by mouth every 30 (thirty) days. with water on empty stomach, nothing by mouth or lie down for next 30 minutes.    [provider]  senna-docusate (SENOKOT-S) 8.6-50 MG tablet Take 1 tablet by mouth at bedtime as needed for mild constipation or moderate constipation. 09/06/16   Rai, Vernelle Emerald, MD  traMADol (ULTRAM) 50 MG tablet Take 50 mg by mouth 4 (four) times daily as needed for moderate pain.    [provider]  triamcinolone cream (KENALOG) 0.1 % Apply 1 application topically 2 (two) times daily as needed (for skin irritation).  [provider]  vitamin B-12 (CYANOCOBALAMIN) 1000 MCG tablet Take 1,000 mcg by mouth daily.    [provider]  vitamin C (ASCORBIC ACID) 500 MG tablet Take 500 mg by mouth daily.    [provider]  Wheat Dextrin (BENEFIBER) POWD Benefiber 1 tablespoon with meals 3 times daily 09/06/16   Rai, Vernelle Emerald, MD    Family History No family history on file.  Social History Social History   Tobacco Use  . Smoking status: Never Smoker  . Smokeless tobacco: Never Used  Substance Use Topics  . Alcohol use: No  . Drug use: No     Allergies   Patient has no known allergies.   Review of Systems Review of Systems  All other  systems reviewed and are negative.    Physical Exam Updated Vital Signs BP (!) 158/65   Pulse 85   Temp 98.6 F (37 C) (Oral)   Resp (!) 22   Ht 1.524 m (5')   Wt 49.9 kg   SpO2 98%   BMI 21.48 kg/m   Physical Exam Vitals signs and nursing note reviewed.  Constitutional:      General: She is not in acute distress.    Appearance: She is well-developed.  HENT:     Head: Normocephalic and atraumatic.     Mouth/Throat:     Pharynx: No oropharyngeal exudate.  Eyes:     General: No scleral icterus.       Right eye: No discharge.        Left eye: No discharge.     Conjunctiva/sclera: Conjunctivae normal.     Pupils: Pupils are equal, round, and reactive to light.  Neck:     Musculoskeletal: Normal range of motion and neck supple.     Thyroid: No thyromegaly.     Vascular: No JVD.  Cardiovascular:     Rate and Rhythm: Normal rate and regular rhythm.     Heart sounds: Normal heart sounds. No murmur. No friction rub. No gallop.   Pulmonary:     Effort: Pulmonary effort is normal. No respiratory distress.     Breath sounds: Normal breath sounds. No wheezing or rales.  Abdominal:     General: Bowel sounds are normal. There is no distension.     Palpations: Abdomen is soft. There is no mass.     Tenderness: There is no abdominal tenderness.  Genitourinary:    Comments: Chaperone present for exam - has dark red blood in rectal vault, no hemorrhoids / fissures. Stool present in the rectal vault Musculoskeletal: Normal range of motion.        General: No tenderness.  Lymphadenopathy:     Cervical: No cervical adenopathy.  Skin:    General: Skin is warm and dry.     Findings: No erythema or rash.  Neurological:     Mental Status: She is alert.     Coordination: Coordination normal.  Psychiatric:        Behavior: Behavior normal.      ED Treatments / Results  Labs (all labs ordered are listed, but only abnormal results are displayed) Labs Reviewed  COMPREHENSIVE  METABOLIC PANEL - Abnormal; Notable for the following components:      Result Value   BUN 25 (*)    Total Protein 6.2 (*)    Alkaline Phosphatase 36 (*)    GFR calc non Af Amer 48 (*)    GFR calc Af Amer 56 (*)    All  other components within normal limits  CBC - Abnormal; Notable for the following components:   RBC 3.71 (*)    Hemoglobin 11.4 (*)    All other components within normal limits  POC OCCULT BLOOD, ED - Abnormal; Notable for the following components:   Fecal Occult Bld POSITIVE (*)    All other components within normal limits  LIPASE, BLOOD  URINALYSIS, ROUTINE W REFLEX MICROSCOPIC  TYPE AND SCREEN    EKG None  Radiology No results found.  Procedures Procedures (including critical care time)  Medications Ordered in ED Medications  famotidine (PEPCID) IVPB 20 mg premix (has no administration in time range)  pantoprazole (PROTONIX) injection 40 mg (has no administration in time range)     Initial Impression / Assessment and Plan / ED Course  I have reviewed the triage vital signs and the nursing notes.  Pertinent labs & imaging results that were available during my care of the patient were reviewed by me and considered in my medical decision making (see chart for details).  Clinical Course as of Mar 17 1326  Sun Mar 17, 2018  1114 The patient has seen Dr. Lyndel Safe in the past, at this time she does not have a surgical abdomen, she does have increased bowel sounds, all of this is consistent with a gastrointestinal bleed.   [BM]    Clinical Course User Index [BM] Noemi Chapel, MD    The patient has a gastrointestinal bleed, concern for diverticulosis or higher bleed.  This given the color of the dark looking blood.  Will check labs, rule out severe anemia, the patient is not tachycardic or hypotensive but will undoubtedly need admission for trending of hemoglobins and possible GI consultation if she becomes unstable.  Gust care with the internal medicine  resident who will admit as well as with Dr. Loletha Carrow, of the gastroenterology service who will see in consultation.  Final Clinical Impressions(s) / ED Diagnoses   Final diagnoses:  Rectal bleed     Noemi Chapel, MD 03/17/18 1327    Noemi Chapel, MD 03/17/18 1335
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
## 2018-03-18 ENCOUNTER — Encounter (HOSPITAL_COMMUNITY): Payer: Self-pay | Admitting: Family Medicine

## 2018-03-18 DIAGNOSIS — N184 Chronic kidney disease, stage 4 (severe): Secondary | ICD-10-CM | POA: Diagnosis not present

## 2018-03-18 DIAGNOSIS — I10 Essential (primary) hypertension: Secondary | ICD-10-CM | POA: Diagnosis not present

## 2018-03-18 DIAGNOSIS — Z8719 Personal history of other diseases of the digestive system: Secondary | ICD-10-CM

## 2018-03-18 DIAGNOSIS — K625 Hemorrhage of anus and rectum: Secondary | ICD-10-CM | POA: Diagnosis not present

## 2018-03-18 DIAGNOSIS — R011 Cardiac murmur, unspecified: Secondary | ICD-10-CM | POA: Diagnosis present

## 2018-03-18 DIAGNOSIS — K5731 Diverticulosis of large intestine without perforation or abscess with bleeding: Secondary | ICD-10-CM

## 2018-03-18 DIAGNOSIS — M810 Age-related osteoporosis without current pathological fracture: Secondary | ICD-10-CM

## 2018-03-18 HISTORY — DX: Chronic kidney disease, stage 4 (severe): N18.4

## 2018-03-18 HISTORY — DX: Age-related osteoporosis without current pathological fracture: M81.0

## 2018-03-18 HISTORY — DX: Personal history of other diseases of the digestive system: Z87.19

## 2018-03-18 LAB — CBC
HCT: 36.5 % (ref 36.0–46.0)
HCT: 40.3 % (ref 36.0–46.0)
Hemoglobin: 11.4 g/dL — ABNORMAL LOW (ref 12.0–15.0)
Hemoglobin: 12.7 g/dL (ref 12.0–15.0)
MCH: 30.2 pg (ref 26.0–34.0)
MCH: 30.5 pg (ref 26.0–34.0)
MCHC: 31.2 g/dL (ref 30.0–36.0)
MCHC: 31.5 g/dL (ref 30.0–36.0)
MCV: 96.8 fL (ref 80.0–100.0)
MCV: 96.6 fL (ref 80.0–100.0)
Platelets: 208 10*3/uL (ref 150–400)
Platelets: 255 10*3/uL (ref 150–400)
RBC: 3.77 MIL/uL — ABNORMAL LOW (ref 3.87–5.11)
RBC: 4.17 MIL/uL (ref 3.87–5.11)
RDW: 12.4 % (ref 11.5–15.5)
RDW: 12.3 % (ref 11.5–15.5)
WBC: 6.3 10*3/uL (ref 4.0–10.5)
WBC: 9.3 10*3/uL (ref 4.0–10.5)
nRBC: 0 % (ref 0.0–0.2)
nRBC: 0 % (ref 0.0–0.2)

## 2018-03-18 LAB — BASIC METABOLIC PANEL
Anion gap: 11 (ref 5–15)
BUN: 20 mg/dL (ref 8–23)
CO2: 26 mmol/L (ref 22–32)
Calcium: 8.7 mg/dL — ABNORMAL LOW (ref 8.9–10.3)
Chloride: 105 mmol/L (ref 98–111)
Creatinine, Ser: 1.11 mg/dL — ABNORMAL HIGH (ref 0.44–1.00)
GFR calc Af Amer: 49 mL/min — ABNORMAL LOW (ref >60)
GFR calc non Af Amer: 42 mL/min — ABNORMAL LOW (ref >60)
Glucose, Bld: 94 mg/dL (ref 70–99)
Potassium: 3.7 mmol/L (ref 3.5–5.1)
Sodium: 142 mmol/L (ref 135–145)

## 2018-03-18 LAB — HEMOGLOBIN AND HEMATOCRIT, BLOOD
HCT: 39.7 % (ref 36.0–46.0)
Hemoglobin: 12.5 g/dL (ref 12.0–15.0)

## 2018-03-18 MED ORDER — DEXTROMETHORPHAN POLISTIREX ER 30 MG/5ML PO SUER
30.0000 mg | Freq: Two times a day (BID) | ORAL | Status: DC
  Filled 2018-03-18 (×2): qty 5

## 2018-03-18 MED ORDER — LORATADINE 10 MG PO TABS
10.0000 mg | ORAL_TABLET | Freq: Every day | ORAL | Status: DC
  Administered 2018-03-18: 10 mg via ORAL
  Filled 2018-03-18: qty 1

## 2018-03-18 MED ORDER — TRAMADOL HCL 50 MG PO TABS: 50.0000 mg | ORAL_TABLET | Freq: Two times a day (BID) | ORAL | Status: DC | PRN

## 2018-03-18 NOTE — Progress Notes (Addendum)
          Daily Rounding Note  03/18/2018, 11:17 AM  LOS: 1 day   SUBJECTIVE:   Chief complaint: diverticular bleed    Small, formed, dark stool with blood earlier.  No weakness, dizziness, dyspnea.  Tolerating solid foods  OBJECTIVE:         Vital signs in last 24 hours:    Temp:  [98 F (36.7 C)-98.3 F (36.8 C)] 98.3 F (36.8 C) (12/16 0507) Pulse Rate:  [74-88] 74 (12/16 0507) Resp:  [14-25] 17 (12/16 0507) BP: (137-180)/(53-102) 137/56 (12/16 0507) SpO2:  [91 %-100 %] 96 % (12/16 0507) Last BM Date: 03/17/18 Filed Weights   03/17/18 1045  Weight: 49.9 kg   General: frail.  Looks young for age.     Heart: RRR Chest: clear bil.   Abdomen: soft, NT, ND.  Active BS  Extremities: no CCE Neuro/Psych:  Oriented x 3.  Moves all 4s.  No tremor.  Very HOH.    Intake/Output from previous day: 12/15 0701 - 12/16 0700 In: 123.3 [IV Piggyback:123.3] Out: -   Intake/Output this shift: No intake/output data recorded.  Lab Results: Recent Labs    03/17/18 2134 03/18/18 0452 03/18/18 0923  WBC 7.1 6.3 9.3  HGB 10.5* 11.4* 12.7  HCT 32.7* 36.5 40.3  PLT 186 208 255   BMET Recent Labs    03/17/18 1055 03/18/18 0452  NA 140 142  K 4.2 3.7  CL 103 105  CO2 23 26  GLUCOSE 87 94  BUN 25* 20  CREATININE 0.99 1.11*  CALCIUM 9.0 8.7*   LFT Recent Labs    03/17/18 1055  PROT 6.2*  ALBUMIN 3.6  AST 21  ALT 10  ALKPHOS 36*  BILITOT 0.8    Studies/Results: No results found.   Scheduled Meds: . dextromethorphan  30 mg Oral BID  . loratadine  10 mg Oral Daily  . polyethylene glycol  17 g Oral Daily  . vitamin B-12  1,000 mcg Oral Daily   Continuous Infusions: PRN Meds:.ALPRAZolam, senna-docusate, traMADol   ASSESMENT:   *  LGIB with painless hematochezia.  Presumed recurrent diverticular bleeding.   latest colonoscopy 08/2017 at time of diverticular bleed: severe diverticulosis, rectal stenosis, small  anal fissure.   Constipation chronically on Colace, [place]  *  Mild anemia. Hgb 11.4 >> 10.5 >> 12.7 without any PRBCs.    *   Anxiety.  *   Hearing loss.     PLAN   *   Ok to discharge later today unless she passes recurrent hematochezia.  PRN fup with GI (family, dtr wants Henrene Pastor if ever needed)     Azucena Freed  03/18/2018, 11:17 AM Phone [phone]

## 2018-03-18 NOTE — Consult Note (Signed)
[facility] CM Primary Care Navigator  03/18/2018  Saadia Dewitt 24-Nov-1920 [id number]   Met with patient at the bedside to identify possible discharge needs.  Patient reports "bleeding in rectum" that had led to this admission. (BRBPR- Bright red blood per rectum likely secondary to diverticulosis, rectal stenosis and a small posterior anal fissure; mild anemia, HTN).  Patient endorsesDr. Nelda Bucks with [facility] as herprimary [facility] provider.   Patient shared [place] to obtain medications without any problem.   Patientstatesthatshehas beenmanaging herown medications at [employer] use of "pill box' system filled weekly.   Patientverbalized that her daughter Blanch Media) has been providing transportation  toherdoctors' appointments.  Patient reports living alone at home but daughter has been taking care of her needs and assisting her if needed.   Anticipated discharge plan ishome per patient.  Patientvoiced understanding to call primary care provider's office for a post discharge follow-up appointment within1- 2 weeks orsooner if needs arise. Patient letter (with PCP's contact number) wasprovided asa reminder.   Discussed with patientregarding [facility]-CM services available for health management andresourcesat home butpatient denies any current needs or concerns for now.  Patient expressed understandingof needto seekreferral from primary care provider to [facility] care management ifdeemed necessary and appropriatefor anyservicesin the future.  [facility] care management information was provided for futureneeds thatshemay have.  Patient however, hadverbally agreedand optedfor EMMI calls tofollow-up withherrecovery at home.   Referral made for [facility] General calls after discharge.    For additional questions please contact:  Edwena Felty A. Shirl Ludington, BSN, RN-BC [facility] PRIMARY CARE  Navigator Cell: [phone]

## 2018-03-18 NOTE — Evaluation (Signed)
Physical Therapy Evaluation Patient Details Name: Melissa Hamilton MRN: [id number] DOB: 27-Jan-1921 Today's Date: 03/18/2018   History of Present Illness  84 y.o. female presenting with 3 bloody bowel movements in the past 2 days. PMH is significant for diverticulosis, anal fissures, GERD, Osteoporosis, HTN, HLD.  Clinical Impression  Pt admitted with above diagnosis. Pt currently with functional limitations due to the deficits listed below (see PT Problem List). Pt ambulated 200' 1/2 with HHA and 1/2 with no physical assist. Pt drifts without support but no LOB. HR 104 bpm, O2 sats 98%. Pt concerned about progression of kyphosis, reviewed postural exercises with her and gave her a handout to take home to slow progression. Will follow acutely but do not anticipate her needing PT after d/c.  Pt will benefit from skilled PT to increase their independence and safety with mobility to allow discharge to the venue listed below.       Follow Up Recommendations No PT follow up    Equipment Recommendations  None recommended by PT    Recommendations for Other Services       Precautions / Restrictions Precautions Precautions: Fall Restrictions Weight Bearing Restrictions: No      Mobility  Bed Mobility               General bed mobility comments: received in chair  Transfers Overall transfer level: Needs assistance Equipment used: None Transfers: Sit to/from Stand Sit to Stand: Supervision         General transfer comment: supervision given, pt without LOB  Ambulation/Gait Ambulation/Gait assistance: Min assist;Min guard Gait Distance (Feet): 200 Feet Assistive device: Rolling walker (2 wheeled);None Gait Pattern/deviations: Step-through pattern;Drifts right/left Gait velocity: faster without HHA than with it Gait velocity interpretation: 1.31 - 2.62 ft/sec, indicative of limited community ambulator General Gait Details: pt began with RW but she feels that it gets in her  way so set aside. First 59' with HHA but then encouraged her to not use HHA since she does not want to use RW in the day. Second 100' without HHA, no LOB but drift R and L.   Stairs            Wheelchair Mobility    Modified Rankin (Stroke Patients Only)       Balance Overall balance assessment: Mild deficits observed, not formally tested                                           Pertinent Vitals/Pain Pain Assessment: No/denies pain Faces Pain Scale: No hurt Pain Intervention(s): Monitored during session    Home Living Family/patient expects to be discharged to:: Private residence Living Arrangements: Alone Available Help at Discharge: Family;Available PRN/intermittently Type of Home: House Home Access: Ramped entrance     Home Layout: Two level;Able to live on main level with bedroom/bathroom Home Equipment: Shower seat;Cane - single point;Walker - 2 wheels;Toilet riser;Grab bars - tub/shower Additional Comments: Daughter comes to assist three days a week    Prior Function Level of Independence: Independent with assistive device(s)         Comments: Uses RW at night     Hand Dominance   Dominant Hand: Right    Extremity/Trunk Assessment   Upper Extremity Assessment Upper Extremity Assessment: Defer to OT evaluation    Lower Extremity Assessment Lower Extremity Assessment: Overall WFL for tasks assessed    Cervical /  Trunk Assessment Cervical / Trunk Assessment: Kyphotic  Communication   Communication: HOH  Cognition Arousal/Alertness: Awake/alert Behavior During Therapy: WFL for tasks assessed/performed Overall Cognitive Status: Within Functional Limits for tasks assessed                                 General Comments: mild STM deficits appropriate for age      General Comments General comments (skin integrity, edema, etc.): discussed indications for RW use in the day and pt voiced understanding, though at  this point she is OK with it only at night. Went over general strengthening exercise handout and gave it to her to take home.     Exercises Other Exercises Other Exercises: standing feet together x30 secs Other Exercises: standing scapular retraction x10 Other Exercises: standing UE abduction x10   Assessment/Plan    PT Assessment Patient needs continued PT services  PT Problem List Decreased activity tolerance;Decreased balance;Decreased mobility       PT Treatment Interventions DME instruction;Gait training;Functional mobility training;Therapeutic activities;Therapeutic exercise;Balance training;Patient/family education    PT Goals (Current goals can be found in the Care Plan section)  Acute Rehab PT Goals Patient Stated Goal: 'Find out why I am bleeding" PT Goal Formulation: With patient Time For Goal Achievement: 04/01/18 Potential to Achieve Goals: Good    Frequency Min 3X/week   Barriers to discharge        Co-evaluation               AM-PAC PT "6 Clicks" Mobility  Outcome Measure Help needed turning from your back to your side while in a flat bed without using bedrails?: None Help needed moving from lying on your back to sitting on the side of a flat bed without using bedrails?: None Help needed moving to and from a bed to a chair (including a wheelchair)?: None Help needed standing up from a chair using your arms (e.g., wheelchair or bedside chair)?: None Help needed to walk in hospital room?: None Help needed climbing 3-5 steps with a railing? : A Little 6 Click Score: 23    End of Session   Activity Tolerance: Patient tolerated treatment well Patient left: in chair;with call bell/phone within reach Nurse Communication: Mobility status PT Visit Diagnosis: Other abnormalities of gait and mobility (R26.89)    Time: 5465-0354 PT Time Calculation (min) (ACUTE ONLY): 26 min   Charges:   PT Evaluation $PT Eval Moderate Complexity: 1 Mod PT  Treatments $Gait Training: 8-22 mins        [place]  Pager [phone] Office [place] 03/18/2018, 11:47 AM

## 2018-03-18 NOTE — Care Management CC44 (Signed)
Condition Code 44 Documentation Completed  Patient Details  Name: Melissa Hamilton MRN: [id number] Date of Birth: 1920/07/19   Condition Code 44 given:  Yes Patient signature on Condition Code 44 notice:  Yes Documentation of 2 MD's agreement:  Yes Code 44 added to claim:  Yes    Sharin Mons, RN 03/18/2018, 5:03 PM

## 2018-03-18 NOTE — Progress Notes (Addendum)
Family Medicine Teaching Service Daily Progress Note Intern Pager: [phone]  Patient name: Melissa Hamilton Medical record number: [id number] Date of birth: [birth year] Age: 84 y.o. Gender: female  Primary Care Provider: Nicoletta Dress, MD Consultants: GI Code Status: DNR  Pt Overview and Major Events to Date:  12/15 admit for BRBPR  Assessment and Plan: Melissa Hamilton is a 84 y.o. female presenting with 3 bloody bowel movements in the past 2 days. PMH is significant for diverticulosis, anal fissures, GERD, Osteoporosis, HTN, HLD.  Bright red blood per rectum likely secondary to diverticulosis-hemoglobin appears to have improved overnight to 11.4 < 10.5 <10.7< 11.4.  Hemoglobin and vitals remained stable overnight.  She reports 3 bloody bowel movements overnight the last of which occurring around 3 AM. -GI following, appreciate recs -H/H every 12 -Monitor vitals per nursing  Hypertension- -Continue aliskiren 150 mg daily -daily BMP  Chronic low back pain- She takes tramadol 50 mg every 6 hours as needed at home. -Tramadol 50 mg every 6 PRN  GERD -Continue famotidine 20 mg  Constipation  -Continue MiraLAX as needed -Continue senna-docusate as needed  Anxiety -  Her home medications include Xanax 0.25 mg every 8 hours as needed for anxiety. -Continue Xanax 0.25 mg every 8 as needed  FEN/GI: Famotidine, general diet Prophylaxis: SCDs, no anticoagulation due to GI bleed  Disposition: DC home following 24 hours without BRBPR  Subjective:  She reports 3 bloody bowel movements overnight.  The most recent being around 3 AM.  She has no other complaints this morning.  Patient was asking if medication could be given to help stop the bleeding.  She was told that she is primarily here for observation in case she needs blood.  Patient wanted to know if she could go home if she is not receiving medication here.  She reported mild epigastric tenderness on exam that felt  different from her typical GERD symptoms.  Objective: Temp:  [98 F (36.7 C)-98.6 F (37 C)] 98.3 F (36.8 C) (12/16 0507) Pulse Rate:  [74-88] 74 (12/16 0507) Resp:  [11-25] 17 (12/16 0507) BP: (137-182)/(53-102) 137/56 (12/16 0507) SpO2:  [91 %-100 %] 96 % (12/16 0507) Weight:  [49.9 kg] 49.9 kg (12/15 1045)  Physical Exam: General: Alert and cooperative and appears to be in no acute distress HEENT: Neck non-tender without lymphadenopathy, masses or thyromegaly Cardio: Normal A1 and S2, no S3 or S4. Rhythm is regular.  3/6 systolic murmur loudest at apex, musical quality, crescendo. Pulm: Clear to auscultation bilaterally, no crackles, wheezing, or diminished breath sounds. Normal respiratory effort Abdomen: Bowel sounds normal. Abdomen soft.  Mild epigastric tenderness with palpation. Extremities: No peripheral edema. Warm/ well perfused.  Strong radial and pedal pulses. Neuro: Cranial nerves grossly intact    Laboratory: Recent Labs  Lab 03/17/18 1814 03/17/18 2134 03/18/18 0452  WBC 6.9 7.1 6.3  HGB 10.7* 10.5* 11.4*  HCT 35.2* 32.7* 36.5  PLT 202 186 208   Recent Labs  Lab 03/17/18 1055 03/18/18 0452  NA 140 142  K 4.2 3.7  CL 103 105  CO2 23 26  BUN 25* 20  CREATININE 0.99 1.11*  CALCIUM 9.0 8.7*  PROT 6.2*  --   BILITOT 0.8  --   ALKPHOS 36*  --   ALT 10  --   AST 21  --   GLUCOSE 87 94    Imaging/Diagnostic Tests:   Matilde Haymaker, MD 03/18/2018, 6:18 AM PGY-1, [place] Intern pager: [phone], text  pages welcome

## 2018-03-18 NOTE — Care Management Obs Status (Signed)
[place] NOTIFICATION   Patient Details  Name: Melissa Hamilton MRN: [id number] Date of Birth: [birth year]   Medicare Observation Status Notification Given:  Yes    Sharin Mons, RN 03/18/2018, 5:03 PM

## 2018-03-18 NOTE — Progress Notes (Signed)
Discharge instructions given to patient. Patient expressed understanding. IVs removed without complication. Patient dressed and pushed in wheelchair by NT. Family taking patient home and will follow up with primary care.

## 2018-03-18 NOTE — Evaluation (Signed)
Occupational Therapy Evaluation Patient Details Name: Melissa Hamilton MRN: [id number] DOB: 1920-04-27 Today's Date: 03/18/2018    History of Present Illness 84 y.o. female presenting with 3 bloody bowel movements in the past 2 days. PMH is significant for diverticulosis, anal fissures, GERD, Osteoporosis, HTN, HLD.   Clinical Impression   PTA, pt was living alone and was performing ADLs and simple IADLs; pt reports her daughter gets groceries and drives her to appointments. Pt currently requiring Supervision-Min Guard A for safety. Pt presenting near baseline function. However, pt reporting she feels weak and fatigued compared to baseline. Pt would benefit from further acute OT to facilitate safe dc. Recommend dc to home once medically stable per physician.      Follow Up Recommendations  No OT follow up;Supervision/Assistance - 24 hour    Equipment Recommendations  None recommended by OT    Recommendations for Other Services PT consult     Precautions / Restrictions Precautions Precautions: Fall      Mobility Bed Mobility               General bed mobility comments: Sitting at EOB with RN upon arrival  Transfers Overall transfer level: Needs assistance Equipment used: None Transfers: Sit to/from Stand Sit to Stand: Min guard         General transfer comment: Min Guard A for safety    Balance Overall balance assessment: Mild deficits observed, not formally tested                                         ADL either performed or assessed with clinical judgement   ADL Overall ADL's : Needs assistance/impaired Eating/Feeding: Independent;Sitting   Grooming: Oral care;Wash/dry face;Brushing hair;Standing;Set up;Supervision/safety   Upper Body Bathing: Supervision/ safety;Set up;Sitting   Lower Body Bathing: Min guard;Sit to/from stand   Upper Body Dressing : Supervision/safety;Set up;Sitting   Lower Body Dressing: Min guard;Sit to/from  stand   Toilet Transfer: Min guard;Ambulation(simulated to recliner)           Functional mobility during ADLs: Min guard General ADL Comments: Pt performing ADLs and functional mobility at [facility] A for safety. Near baseline function however reports that she feels weaker.      Vision         Perception     Praxis      Pertinent Vitals/Pain Pain Assessment: Faces Faces Pain Scale: No hurt Pain Intervention(s): Monitored during session     Hand Dominance Right   Extremity/Trunk Assessment Upper Extremity Assessment Upper Extremity Assessment: Overall WFL for tasks assessed(Age appropiate)   Lower Extremity Assessment Lower Extremity Assessment: Overall WFL for tasks assessed   Cervical / Trunk Assessment Cervical / Trunk Assessment: Kyphotic   Communication Communication Communication: HOH   Cognition Arousal/Alertness: Awake/alert Behavior During Therapy: WFL for tasks assessed/performed Overall Cognitive Status: Within Functional Limits for tasks assessed                                     General Comments  Pt concerned stating "I want to know why I am bleeding."    Exercises     Shoulder Instructions      Home Living Family/patient expects to be discharged to:: Private residence Living Arrangements: Alone Available Help at Discharge: Family;Available PRN/intermittently Type of Home: House Home Access: Ramped  entrance     Home Layout: Two level;Able to live on main level with bedroom/bathroom(Basement) Alternate Level Stairs-Number of Steps: Flight   Bathroom Shower/Tub: Occupational psychologist: Standard(toilet riser)     Home Equipment: Shower seat;Cane - single point;Walker - 2 wheels;Toilet riser;Grab bars - tub/shower   Additional Comments: Daughter comes to assist three days a week      Prior Functioning/Environment Level of Independence: Independent with assistive device(s)        Comments: Uses RW at  night        OT Problem List: Decreased strength;Decreased range of motion;Decreased activity tolerance;Impaired balance (sitting and/or standing);Decreased knowledge of use of DME or AE;Decreased knowledge of precautions      OT Treatment/Interventions: Self-care/ADL training;Therapeutic exercise;Energy conservation;DME and/or AE instruction;Therapeutic activities;Patient/family education    OT Goals(Current goals can be found in the care plan section) Acute Rehab OT Goals Patient Stated Goal: 'Find out why I am bleeding" OT Goal Formulation: With patient Time For Goal Achievement: 04/01/18 Potential to Achieve Goals: Good  OT Frequency: Min 2X/week   Barriers to D/C:            Co-evaluation              AM-PAC OT "6 Clicks" Daily Activity     Outcome Measure Help from another person eating meals?: None Help from another person taking care of personal grooming?: A Little Help from another person toileting, which includes using toliet, bedpan, or urinal?: A Little Help from another person bathing (including washing, rinsing, drying)?: A Little Help from another person to put on and taking off regular upper body clothing?: None Help from another person to put on and taking off regular lower body clothing?: A Little 6 Click Score: 20   End of Session Nurse Communication: Mobility status  Activity Tolerance: Patient tolerated treatment well Patient left: in chair;with call bell/phone within reach  OT Visit Diagnosis: Unsteadiness on feet (R26.81);Other abnormalities of gait and mobility (R26.89);Muscle weakness (generalized) (M62.81)                Time: 8889-1694 OT Time Calculation (min): 24 min Charges:  OT General Charges $OT Visit: 1 Visit OT Evaluation $OT Eval Low Complexity: 1 Low OT Treatments $Self Care/Home Management : 8-22 mins  Melissa Hamilton MSOT, OTR/L Acute Rehab Pager: [phone] Office: [place] 03/18/2018, 10:04  AM

## 2018-03-18 NOTE — Discharge Summary (Signed)
[facility] Discharge Summary  Patient name: Melissa Hamilton Medical record number: [id number] Date of birth: February 19, 1921 Age: 84 y.o. Gender: female Date of Admission: 03/17/2018  Date of Discharge: 03/18/2018 Admitting Physician: Blane Ohara McDiarmid, MD  Primary Care Provider: Nicoletta Dress, MD Consultants: GI  Indication for Hospitalization: GI bleed  Discharge Diagnoses/Problem List:  GI bleed, likely diverticulosis Hypertension Chronic low back pain GERD Constipation Anxiety  Disposition: Discharge home  Discharge Condition: Stable  Discharge Exam:  General: Alert and cooperative and appears to be in no acute distress HEENT: Neck non-tender without lymphadenopathy, masses or thyromegaly Cardio: Normal A1 and S2, no S3 or S4. Rhythm is regular.  3/6 systolic murmur loudest at apex, musical quality, crescendo. Pulm: Clear to auscultation bilaterally, no crackles, wheezing, or diminished breath sounds. Normal respiratory effort Abdomen: Bowel sounds normal. Abdomen soft.  Mild epigastric tenderness with palpation. Extremities: No peripheral edema. Warm/ well perfused.  Strong radial and pedal pulses. Neuro: Cranial nerves grossly intact  Brief Hospital Course:  Melissa Hamilton presented to the ED on 12/15 with 3 days of bloody bowel movements.  She has a previous medical history that is remarkable for diverticulosis with significant bleeding requiring transfusion, anal fissures, GERD.  On presentation, her vital signs were stable, her physical exam was remarkable for blood in the rectal vault and her admission labs are remarkable for hemoglobin of 11.2.  Per GI recommendations, she was admitted brought up to the floor for observation due to her previous history of GI bleeds requiring transfusion.  She was monitored overnight during which time she experienced 3 additional bloody bowel movements while her hemoglobin was trended.  Morning labs showed a stable  hemoglobin of 11.2.  On the morning of 12/16 she is found to be medically stable and ready for discharge home.  Issues for Follow Up:  1. No medication changes were made during this hospitalization 2. Follow-up with GI to ensure resolution of GI bleeding.  Significant Procedures: none  Significant Labs and Imaging:  Recent Labs  Lab 03/17/18 2134 03/18/18 0452 03/18/18 0923 03/18/18 1712  WBC 7.1 6.3 9.3  --   HGB 10.5* 11.4* 12.7 12.5  HCT 32.7* 36.5 40.3 39.7  PLT 186 208 255  --    Recent Labs  Lab 03/17/18 1055 03/18/18 0452  NA 140 142  K 4.2 3.7  CL 103 105  CO2 23 26  GLUCOSE 87 94  BUN 25* 20  CREATININE 0.99 1.11*  CALCIUM 9.0 8.7*  ALKPHOS 36*  --   AST 21  --   ALT 10  --   ALBUMIN 3.6  --     No results found.   Results/Tests Pending at Time of Discharge: none  Discharge Medications:  Allergies as of 03/18/2018   No Known Allergies     Medication List    TAKE these medications   aliskiren 150 MG tablet Commonly known as:  TEKTURNA Take 150 mg by mouth at bedtime.   ALPRAZolam 0.25 MG tablet Commonly known as:  XANAX Take 0.25 mg by mouth at bedtime.   BENEFIBER Powd Benefiber 1 tablespoon with meals 3 times daily What changed:    how much to take  how to take this  when to take this  additional instructions   CALTRATE 600+D PLUS PO Take 1 tablet by mouth 2 (two) times daily with a meal.   docusate sodium 100 MG capsule Commonly known as:  COLACE Take 100 mg by mouth daily.  GLUCOSAMINE-MSM PO Take 1 tablet by mouth daily with supper.   HAIR/SKIN/NAILS/BIOTIN Tabs Take 1 tablet by mouth daily.   hydrocortisone 25 MG suppository Commonly known as:  ANUSOL-HC Place 1 suppository (25 mg total) rectally 2 (two) times daily.   multivitamin with minerals Tabs tablet Take 1 tablet by mouth daily.   polyethylene glycol packet Commonly known as:  MIRALAX / GLYCOLAX Take 17 g by mouth daily.   potassium chloride 10 MEQ  tablet Commonly known as:  K-DUR,KLOR-CON Take 10 mEq by mouth 2 (two) times daily with a meal.   risedronate 150 MG tablet Commonly known as:  ACTONEL Take 150 mg by mouth every 30 (thirty) days. Take with water on empty stomach, nothing by mouth and do not lie down for next 30 minutes. -  On the 1st Monday of each month   senna 8.6 MG Tabs tablet Commonly known as:  SENOKOT Take 1 tablet by mouth at bedtime as needed for mild constipation.   senna-docusate 8.6-50 MG tablet Commonly known as:  Senokot-S Take 1 tablet by mouth at bedtime as needed for mild constipation or moderate constipation.   traMADol 50 MG tablet Commonly known as:  ULTRAM Take 50 mg by mouth 2 (two) times daily.   vitamin B-12 1000 MCG tablet Commonly known as:  CYANOCOBALAMIN Take 1,000 mcg by mouth daily.   vitamin C 500 MG tablet Commonly known as:  ASCORBIC ACID Take 500 mg by mouth daily.   VITAMIN D3 PO Take 1 tablet by mouth daily with supper.       Discharge Instructions: Please refer to Patient Instructions section of EMR for full details.  Patient was counseled important signs and symptoms that should prompt return to medical care, changes in medications, dietary instructions, activity restrictions, and follow up appointments.   Follow-Up Appointments:   Matilde Haymaker, MD 03/18/2018, 5:54 PM PGY-1, [place]

## 2018-03-25 ENCOUNTER — Other Ambulatory Visit: Payer: Self-pay | Admitting: *Deleted

## 2018-03-25 NOTE — Patient Outreach (Addendum)
[facility]) Care Management  03/25/2018  Melissa Hamilton 09-20-1920 [id number]   EMMI-general discharge,  RED ON EMMI ALERT Day # 4 Date: Sunday 03/24/18  Red Alert Reason:Lost interest in things?Yes Other questions/problems?Yes  Insurance:  Medicare A & B, blue cross and blue shield federal supplement [facility] admissions x 1 [facility]-diverticulosis/GI bleed   ED visits x 1 in the last 6 months     Outreach attempt # 1 No answer. [facility] RN CM left HIPAA compliant voicemail message along with CM's contact info.   Plan: [facility] RN CM sent an unsuccessful outreach letter and scheduled this patient for another call attempt within 4 business days  Hasina Kreager L. Lavina Hamman, RN, BSN, [place] Coordinator Office number [phone] Mobile number [phone]  Main [facility] number [phone] Fax number [phone]

## 2018-03-25 NOTE — Patient Outreach (Signed)
Patient triggered Red on EMMI General Discharge Dashboard, notification sent to:  Kimberly Gibbs, RN 

## 2018-03-26 ENCOUNTER — Other Ambulatory Visit: Payer: Self-pay | Admitting: *Deleted

## 2018-03-26 NOTE — Patient Outreach (Addendum)
Melissa Hamilton) Care Management  03/26/2018  Melissa Hamilton 04-May-1920 [id number]   EMMI-general discharge,  RED ON EMMI ALERT Day # 4 Date: Sunday 03/24/18  Red Alert Reason:Lost interest in things?Yes Other questions/problems?Yes  Insurance:  Medicare A & B, blue cross and blue shield federal supplement [facility] admissions x 1 [facility]-diverticulosis/GI bleed   ED visits x 1 in the last 6 months     Outreach attempt # 2 Melissa Hamilton is hard of hearing She switched phone but still was not able to hear well  She gave the phone to Melissa Hamilton who confirms she is doing well and had trouble hearing call from Sunday 12.22.19 therefore the answers to the EMMI questions are incorrect as yes and should be no Conditions diverticulosis HTN, CKD stage 4 anemia, cardia murmurs, HOH  Plan: [facility] RN CM will close case at this time as patient has been assessed and no needs identified.     [name]. Lavina Hamman, RN, BSN, [place] Coordinator Office number [phone] Mobile number [phone]  Main [facility] number [phone] Fax number [phone]

## 2018-06-03 DIAGNOSIS — H5213 Myopia, bilateral: Secondary | ICD-10-CM | POA: Diagnosis not present

## 2018-06-03 DIAGNOSIS — D3131 Benign neoplasm of right choroid: Secondary | ICD-10-CM | POA: Diagnosis not present

## 2018-09-16 DIAGNOSIS — D62 Acute posthemorrhagic anemia: Secondary | ICD-10-CM | POA: Diagnosis not present

## 2018-09-16 DIAGNOSIS — I10 Essential (primary) hypertension: Secondary | ICD-10-CM | POA: Diagnosis not present

## 2018-09-16 DIAGNOSIS — S32050A Wedge compression fracture of fifth lumbar vertebra, initial encounter for closed fracture: Secondary | ICD-10-CM | POA: Diagnosis not present

## 2018-09-16 DIAGNOSIS — M810 Age-related osteoporosis without current pathological fracture: Secondary | ICD-10-CM | POA: Diagnosis not present

## 2018-09-16 DIAGNOSIS — E559 Vitamin D deficiency, unspecified: Secondary | ICD-10-CM | POA: Diagnosis not present

## 2018-09-17 DIAGNOSIS — Z0000 Encounter for general adult medical examination without abnormal findings: Secondary | ICD-10-CM | POA: Diagnosis not present

## 2018-09-17 DIAGNOSIS — Z1331 Encounter for screening for depression: Secondary | ICD-10-CM | POA: Diagnosis not present

## 2018-09-17 DIAGNOSIS — Z9181 History of falling: Secondary | ICD-10-CM | POA: Diagnosis not present

## 2019-01-21 DIAGNOSIS — Z23 Encounter for immunization: Secondary | ICD-10-CM | POA: Diagnosis not present

## 2019-04-07 DIAGNOSIS — R399 Unspecified symptoms and signs involving the genitourinary system: Secondary | ICD-10-CM | POA: Diagnosis not present

## 2019-04-14 DIAGNOSIS — E559 Vitamin D deficiency, unspecified: Secondary | ICD-10-CM | POA: Diagnosis not present

## 2019-04-14 DIAGNOSIS — I10 Essential (primary) hypertension: Secondary | ICD-10-CM | POA: Diagnosis not present

## 2019-04-14 DIAGNOSIS — S32050A Wedge compression fracture of fifth lumbar vertebra, initial encounter for closed fracture: Secondary | ICD-10-CM | POA: Diagnosis not present

## 2019-04-14 DIAGNOSIS — M810 Age-related osteoporosis without current pathological fracture: Secondary | ICD-10-CM | POA: Diagnosis not present

## 2019-04-14 DIAGNOSIS — Z139 Encounter for screening, unspecified: Secondary | ICD-10-CM | POA: Diagnosis not present

## 2020-01-06 DIAGNOSIS — H43393 Other vitreous opacities, bilateral: Secondary | ICD-10-CM | POA: Diagnosis not present

## 2020-01-06 DIAGNOSIS — H21261 Iris atrophy (essential) (progressive), right eye: Secondary | ICD-10-CM | POA: Diagnosis not present

## 2020-01-06 DIAGNOSIS — D3131 Benign neoplasm of right choroid: Secondary | ICD-10-CM | POA: Diagnosis not present

## 2020-01-06 DIAGNOSIS — Z961 Presence of intraocular lens: Secondary | ICD-10-CM | POA: Diagnosis not present

## 2020-01-06 DIAGNOSIS — H01006 Unspecified blepharitis left eye, unspecified eyelid: Secondary | ICD-10-CM | POA: Diagnosis not present

## 2020-01-16 DIAGNOSIS — N390 Urinary tract infection, site not specified: Secondary | ICD-10-CM | POA: Diagnosis not present

## 2020-02-06 DIAGNOSIS — Z23 Encounter for immunization: Secondary | ICD-10-CM | POA: Diagnosis not present

## 2020-04-30 DIAGNOSIS — M25551 Pain in right hip: Secondary | ICD-10-CM | POA: Diagnosis not present

## 2020-05-10 DIAGNOSIS — E559 Vitamin D deficiency, unspecified: Secondary | ICD-10-CM | POA: Diagnosis not present

## 2020-05-10 DIAGNOSIS — M545 Low back pain, unspecified: Secondary | ICD-10-CM | POA: Diagnosis not present

## 2020-05-10 DIAGNOSIS — I10 Essential (primary) hypertension: Secondary | ICD-10-CM | POA: Diagnosis not present

## 2020-05-10 DIAGNOSIS — M25552 Pain in left hip: Secondary | ICD-10-CM | POA: Diagnosis not present

## 2020-05-10 DIAGNOSIS — M25551 Pain in right hip: Secondary | ICD-10-CM | POA: Diagnosis not present

## 2020-05-10 DIAGNOSIS — D62 Acute posthemorrhagic anemia: Secondary | ICD-10-CM | POA: Diagnosis not present

## 2020-05-10 DIAGNOSIS — M810 Age-related osteoporosis without current pathological fracture: Secondary | ICD-10-CM | POA: Diagnosis not present

## 2020-05-10 DIAGNOSIS — S32050A Wedge compression fracture of fifth lumbar vertebra, initial encounter for closed fracture: Secondary | ICD-10-CM | POA: Diagnosis not present

## 2020-05-10 DIAGNOSIS — F419 Anxiety disorder, unspecified: Secondary | ICD-10-CM | POA: Diagnosis not present

## 2020-05-12 DIAGNOSIS — K219 Gastro-esophageal reflux disease without esophagitis: Secondary | ICD-10-CM | POA: Diagnosis not present

## 2020-05-12 DIAGNOSIS — I340 Nonrheumatic mitral (valve) insufficiency: Secondary | ICD-10-CM | POA: Diagnosis not present

## 2020-05-12 DIAGNOSIS — Z79899 Other long term (current) drug therapy: Secondary | ICD-10-CM | POA: Diagnosis not present

## 2020-05-12 DIAGNOSIS — D62 Acute posthemorrhagic anemia: Secondary | ICD-10-CM | POA: Diagnosis not present

## 2020-05-12 DIAGNOSIS — I10 Essential (primary) hypertension: Secondary | ICD-10-CM | POA: Diagnosis not present

## 2020-05-12 DIAGNOSIS — F419 Anxiety disorder, unspecified: Secondary | ICD-10-CM | POA: Diagnosis not present

## 2020-05-12 DIAGNOSIS — S32050D Wedge compression fracture of fifth lumbar vertebra, subsequent encounter for fracture with routine healing: Secondary | ICD-10-CM | POA: Diagnosis not present

## 2020-05-12 DIAGNOSIS — M810 Age-related osteoporosis without current pathological fracture: Secondary | ICD-10-CM | POA: Diagnosis not present

## 2020-05-12 DIAGNOSIS — I872 Venous insufficiency (chronic) (peripheral): Secondary | ICD-10-CM | POA: Diagnosis not present

## 2020-05-12 DIAGNOSIS — I071 Rheumatic tricuspid insufficiency: Secondary | ICD-10-CM | POA: Diagnosis not present

## 2020-05-12 DIAGNOSIS — K5731 Diverticulosis of large intestine without perforation or abscess with bleeding: Secondary | ICD-10-CM | POA: Diagnosis not present

## 2020-05-12 DIAGNOSIS — M25551 Pain in right hip: Secondary | ICD-10-CM | POA: Diagnosis not present

## 2020-05-12 DIAGNOSIS — M25552 Pain in left hip: Secondary | ICD-10-CM | POA: Diagnosis not present

## 2020-05-12 DIAGNOSIS — E559 Vitamin D deficiency, unspecified: Secondary | ICD-10-CM | POA: Diagnosis not present

## 2020-05-12 DIAGNOSIS — K589 Irritable bowel syndrome without diarrhea: Secondary | ICD-10-CM | POA: Diagnosis not present

## 2020-05-12 DIAGNOSIS — M1990 Unspecified osteoarthritis, unspecified site: Secondary | ICD-10-CM | POA: Diagnosis not present

## 2020-05-12 DIAGNOSIS — G4700 Insomnia, unspecified: Secondary | ICD-10-CM | POA: Diagnosis not present

## 2020-05-13 DIAGNOSIS — M545 Low back pain, unspecified: Secondary | ICD-10-CM | POA: Diagnosis not present

## 2020-05-14 DIAGNOSIS — M25552 Pain in left hip: Secondary | ICD-10-CM | POA: Diagnosis not present

## 2020-05-14 DIAGNOSIS — S32050D Wedge compression fracture of fifth lumbar vertebra, subsequent encounter for fracture with routine healing: Secondary | ICD-10-CM | POA: Diagnosis not present

## 2020-05-14 DIAGNOSIS — M810 Age-related osteoporosis without current pathological fracture: Secondary | ICD-10-CM | POA: Diagnosis not present

## 2020-05-14 DIAGNOSIS — F419 Anxiety disorder, unspecified: Secondary | ICD-10-CM | POA: Diagnosis not present

## 2020-05-14 DIAGNOSIS — D62 Acute posthemorrhagic anemia: Secondary | ICD-10-CM | POA: Diagnosis not present

## 2020-05-14 DIAGNOSIS — I340 Nonrheumatic mitral (valve) insufficiency: Secondary | ICD-10-CM | POA: Diagnosis not present

## 2020-05-17 DIAGNOSIS — D62 Acute posthemorrhagic anemia: Secondary | ICD-10-CM | POA: Diagnosis not present

## 2020-05-17 DIAGNOSIS — M810 Age-related osteoporosis without current pathological fracture: Secondary | ICD-10-CM | POA: Diagnosis not present

## 2020-05-17 DIAGNOSIS — M25552 Pain in left hip: Secondary | ICD-10-CM | POA: Diagnosis not present

## 2020-05-17 DIAGNOSIS — I340 Nonrheumatic mitral (valve) insufficiency: Secondary | ICD-10-CM | POA: Diagnosis not present

## 2020-05-17 DIAGNOSIS — S32050D Wedge compression fracture of fifth lumbar vertebra, subsequent encounter for fracture with routine healing: Secondary | ICD-10-CM | POA: Diagnosis not present

## 2020-05-17 DIAGNOSIS — F419 Anxiety disorder, unspecified: Secondary | ICD-10-CM | POA: Diagnosis not present

## 2020-05-18 DIAGNOSIS — D62 Acute posthemorrhagic anemia: Secondary | ICD-10-CM | POA: Diagnosis not present

## 2020-05-18 DIAGNOSIS — I340 Nonrheumatic mitral (valve) insufficiency: Secondary | ICD-10-CM | POA: Diagnosis not present

## 2020-05-18 DIAGNOSIS — M810 Age-related osteoporosis without current pathological fracture: Secondary | ICD-10-CM | POA: Diagnosis not present

## 2020-05-18 DIAGNOSIS — M25552 Pain in left hip: Secondary | ICD-10-CM | POA: Diagnosis not present

## 2020-05-18 DIAGNOSIS — F419 Anxiety disorder, unspecified: Secondary | ICD-10-CM | POA: Diagnosis not present

## 2020-05-18 DIAGNOSIS — S32050D Wedge compression fracture of fifth lumbar vertebra, subsequent encounter for fracture with routine healing: Secondary | ICD-10-CM | POA: Diagnosis not present

## 2020-05-20 DIAGNOSIS — I340 Nonrheumatic mitral (valve) insufficiency: Secondary | ICD-10-CM | POA: Diagnosis not present

## 2020-05-20 DIAGNOSIS — D62 Acute posthemorrhagic anemia: Secondary | ICD-10-CM | POA: Diagnosis not present

## 2020-05-20 DIAGNOSIS — F419 Anxiety disorder, unspecified: Secondary | ICD-10-CM | POA: Diagnosis not present

## 2020-05-20 DIAGNOSIS — M810 Age-related osteoporosis without current pathological fracture: Secondary | ICD-10-CM | POA: Diagnosis not present

## 2020-05-20 DIAGNOSIS — M25552 Pain in left hip: Secondary | ICD-10-CM | POA: Diagnosis not present

## 2020-05-20 DIAGNOSIS — S32050D Wedge compression fracture of fifth lumbar vertebra, subsequent encounter for fracture with routine healing: Secondary | ICD-10-CM | POA: Diagnosis not present

## 2020-05-24 DIAGNOSIS — I340 Nonrheumatic mitral (valve) insufficiency: Secondary | ICD-10-CM | POA: Diagnosis not present

## 2020-05-24 DIAGNOSIS — D62 Acute posthemorrhagic anemia: Secondary | ICD-10-CM | POA: Diagnosis not present

## 2020-05-24 DIAGNOSIS — M810 Age-related osteoporosis without current pathological fracture: Secondary | ICD-10-CM | POA: Diagnosis not present

## 2020-05-24 DIAGNOSIS — M25552 Pain in left hip: Secondary | ICD-10-CM | POA: Diagnosis not present

## 2020-05-24 DIAGNOSIS — S32050D Wedge compression fracture of fifth lumbar vertebra, subsequent encounter for fracture with routine healing: Secondary | ICD-10-CM | POA: Diagnosis not present

## 2020-05-24 DIAGNOSIS — F419 Anxiety disorder, unspecified: Secondary | ICD-10-CM | POA: Diagnosis not present

## 2020-05-26 DIAGNOSIS — M25552 Pain in left hip: Secondary | ICD-10-CM | POA: Diagnosis not present

## 2020-05-26 DIAGNOSIS — S32050D Wedge compression fracture of fifth lumbar vertebra, subsequent encounter for fracture with routine healing: Secondary | ICD-10-CM | POA: Diagnosis not present

## 2020-05-26 DIAGNOSIS — F419 Anxiety disorder, unspecified: Secondary | ICD-10-CM | POA: Diagnosis not present

## 2020-05-26 DIAGNOSIS — I340 Nonrheumatic mitral (valve) insufficiency: Secondary | ICD-10-CM | POA: Diagnosis not present

## 2020-05-26 DIAGNOSIS — M810 Age-related osteoporosis without current pathological fracture: Secondary | ICD-10-CM | POA: Diagnosis not present

## 2020-05-26 DIAGNOSIS — D62 Acute posthemorrhagic anemia: Secondary | ICD-10-CM | POA: Diagnosis not present

## 2020-05-28 DIAGNOSIS — I340 Nonrheumatic mitral (valve) insufficiency: Secondary | ICD-10-CM | POA: Diagnosis not present

## 2020-05-28 DIAGNOSIS — M810 Age-related osteoporosis without current pathological fracture: Secondary | ICD-10-CM | POA: Diagnosis not present

## 2020-05-28 DIAGNOSIS — S32050D Wedge compression fracture of fifth lumbar vertebra, subsequent encounter for fracture with routine healing: Secondary | ICD-10-CM | POA: Diagnosis not present

## 2020-05-28 DIAGNOSIS — M25552 Pain in left hip: Secondary | ICD-10-CM | POA: Diagnosis not present

## 2020-05-28 DIAGNOSIS — D62 Acute posthemorrhagic anemia: Secondary | ICD-10-CM | POA: Diagnosis not present

## 2020-05-28 DIAGNOSIS — F419 Anxiety disorder, unspecified: Secondary | ICD-10-CM | POA: Diagnosis not present

## 2020-05-31 DIAGNOSIS — S32050D Wedge compression fracture of fifth lumbar vertebra, subsequent encounter for fracture with routine healing: Secondary | ICD-10-CM | POA: Diagnosis not present

## 2020-05-31 DIAGNOSIS — D62 Acute posthemorrhagic anemia: Secondary | ICD-10-CM | POA: Diagnosis not present

## 2020-05-31 DIAGNOSIS — F419 Anxiety disorder, unspecified: Secondary | ICD-10-CM | POA: Diagnosis not present

## 2020-05-31 DIAGNOSIS — I340 Nonrheumatic mitral (valve) insufficiency: Secondary | ICD-10-CM | POA: Diagnosis not present

## 2020-05-31 DIAGNOSIS — M25552 Pain in left hip: Secondary | ICD-10-CM | POA: Diagnosis not present

## 2020-05-31 DIAGNOSIS — M810 Age-related osteoporosis without current pathological fracture: Secondary | ICD-10-CM | POA: Diagnosis not present

## 2020-06-01 DIAGNOSIS — F419 Anxiety disorder, unspecified: Secondary | ICD-10-CM | POA: Diagnosis not present

## 2020-06-01 DIAGNOSIS — D62 Acute posthemorrhagic anemia: Secondary | ICD-10-CM | POA: Diagnosis not present

## 2020-06-01 DIAGNOSIS — I340 Nonrheumatic mitral (valve) insufficiency: Secondary | ICD-10-CM | POA: Diagnosis not present

## 2020-06-01 DIAGNOSIS — M810 Age-related osteoporosis without current pathological fracture: Secondary | ICD-10-CM | POA: Diagnosis not present

## 2020-06-01 DIAGNOSIS — M25552 Pain in left hip: Secondary | ICD-10-CM | POA: Diagnosis not present

## 2020-06-01 DIAGNOSIS — S32050D Wedge compression fracture of fifth lumbar vertebra, subsequent encounter for fracture with routine healing: Secondary | ICD-10-CM | POA: Diagnosis not present

## 2020-06-03 DIAGNOSIS — S32050D Wedge compression fracture of fifth lumbar vertebra, subsequent encounter for fracture with routine healing: Secondary | ICD-10-CM | POA: Diagnosis not present

## 2020-06-03 DIAGNOSIS — D62 Acute posthemorrhagic anemia: Secondary | ICD-10-CM | POA: Diagnosis not present

## 2020-06-03 DIAGNOSIS — M25552 Pain in left hip: Secondary | ICD-10-CM | POA: Diagnosis not present

## 2020-06-03 DIAGNOSIS — I340 Nonrheumatic mitral (valve) insufficiency: Secondary | ICD-10-CM | POA: Diagnosis not present

## 2020-06-03 DIAGNOSIS — F419 Anxiety disorder, unspecified: Secondary | ICD-10-CM | POA: Diagnosis not present

## 2020-06-03 DIAGNOSIS — M810 Age-related osteoporosis without current pathological fracture: Secondary | ICD-10-CM | POA: Diagnosis not present

## 2020-06-07 DIAGNOSIS — I340 Nonrheumatic mitral (valve) insufficiency: Secondary | ICD-10-CM | POA: Diagnosis not present

## 2020-06-07 DIAGNOSIS — M810 Age-related osteoporosis without current pathological fracture: Secondary | ICD-10-CM | POA: Diagnosis not present

## 2020-06-07 DIAGNOSIS — D62 Acute posthemorrhagic anemia: Secondary | ICD-10-CM | POA: Diagnosis not present

## 2020-06-07 DIAGNOSIS — S32050D Wedge compression fracture of fifth lumbar vertebra, subsequent encounter for fracture with routine healing: Secondary | ICD-10-CM | POA: Diagnosis not present

## 2020-06-07 DIAGNOSIS — F419 Anxiety disorder, unspecified: Secondary | ICD-10-CM | POA: Diagnosis not present

## 2020-06-07 DIAGNOSIS — M25552 Pain in left hip: Secondary | ICD-10-CM | POA: Diagnosis not present

## 2020-06-08 DIAGNOSIS — F419 Anxiety disorder, unspecified: Secondary | ICD-10-CM | POA: Diagnosis not present

## 2020-06-08 DIAGNOSIS — D62 Acute posthemorrhagic anemia: Secondary | ICD-10-CM | POA: Diagnosis not present

## 2020-06-08 DIAGNOSIS — M25552 Pain in left hip: Secondary | ICD-10-CM | POA: Diagnosis not present

## 2020-06-08 DIAGNOSIS — I340 Nonrheumatic mitral (valve) insufficiency: Secondary | ICD-10-CM | POA: Diagnosis not present

## 2020-06-08 DIAGNOSIS — S32050D Wedge compression fracture of fifth lumbar vertebra, subsequent encounter for fracture with routine healing: Secondary | ICD-10-CM | POA: Diagnosis not present

## 2020-06-08 DIAGNOSIS — M810 Age-related osteoporosis without current pathological fracture: Secondary | ICD-10-CM | POA: Diagnosis not present

## 2020-06-09 DIAGNOSIS — D62 Acute posthemorrhagic anemia: Secondary | ICD-10-CM | POA: Diagnosis not present

## 2020-06-09 DIAGNOSIS — I340 Nonrheumatic mitral (valve) insufficiency: Secondary | ICD-10-CM | POA: Diagnosis not present

## 2020-06-09 DIAGNOSIS — M810 Age-related osteoporosis without current pathological fracture: Secondary | ICD-10-CM | POA: Diagnosis not present

## 2020-06-09 DIAGNOSIS — F419 Anxiety disorder, unspecified: Secondary | ICD-10-CM | POA: Diagnosis not present

## 2020-06-09 DIAGNOSIS — S32050D Wedge compression fracture of fifth lumbar vertebra, subsequent encounter for fracture with routine healing: Secondary | ICD-10-CM | POA: Diagnosis not present

## 2020-06-09 DIAGNOSIS — M25552 Pain in left hip: Secondary | ICD-10-CM | POA: Diagnosis not present

## 2020-06-11 DIAGNOSIS — F419 Anxiety disorder, unspecified: Secondary | ICD-10-CM | POA: Diagnosis not present

## 2020-06-11 DIAGNOSIS — K219 Gastro-esophageal reflux disease without esophagitis: Secondary | ICD-10-CM | POA: Diagnosis not present

## 2020-06-11 DIAGNOSIS — I340 Nonrheumatic mitral (valve) insufficiency: Secondary | ICD-10-CM | POA: Diagnosis not present

## 2020-06-11 DIAGNOSIS — I071 Rheumatic tricuspid insufficiency: Secondary | ICD-10-CM | POA: Diagnosis not present

## 2020-06-11 DIAGNOSIS — G4700 Insomnia, unspecified: Secondary | ICD-10-CM | POA: Diagnosis not present

## 2020-06-11 DIAGNOSIS — I10 Essential (primary) hypertension: Secondary | ICD-10-CM | POA: Diagnosis not present

## 2020-06-11 DIAGNOSIS — S32050D Wedge compression fracture of fifth lumbar vertebra, subsequent encounter for fracture with routine healing: Secondary | ICD-10-CM | POA: Diagnosis not present

## 2020-06-11 DIAGNOSIS — M1990 Unspecified osteoarthritis, unspecified site: Secondary | ICD-10-CM | POA: Diagnosis not present

## 2020-06-11 DIAGNOSIS — D62 Acute posthemorrhagic anemia: Secondary | ICD-10-CM | POA: Diagnosis not present

## 2020-06-11 DIAGNOSIS — M25552 Pain in left hip: Secondary | ICD-10-CM | POA: Diagnosis not present

## 2020-06-11 DIAGNOSIS — E559 Vitamin D deficiency, unspecified: Secondary | ICD-10-CM | POA: Diagnosis not present

## 2020-06-11 DIAGNOSIS — I872 Venous insufficiency (chronic) (peripheral): Secondary | ICD-10-CM | POA: Diagnosis not present

## 2020-06-11 DIAGNOSIS — K5731 Diverticulosis of large intestine without perforation or abscess with bleeding: Secondary | ICD-10-CM | POA: Diagnosis not present

## 2020-06-11 DIAGNOSIS — M810 Age-related osteoporosis without current pathological fracture: Secondary | ICD-10-CM | POA: Diagnosis not present

## 2020-06-11 DIAGNOSIS — Z79899 Other long term (current) drug therapy: Secondary | ICD-10-CM | POA: Diagnosis not present

## 2020-06-11 DIAGNOSIS — K589 Irritable bowel syndrome without diarrhea: Secondary | ICD-10-CM | POA: Diagnosis not present

## 2020-06-11 DIAGNOSIS — M25551 Pain in right hip: Secondary | ICD-10-CM | POA: Diagnosis not present

## 2020-06-14 DIAGNOSIS — M25552 Pain in left hip: Secondary | ICD-10-CM | POA: Diagnosis not present

## 2020-06-14 DIAGNOSIS — S32050D Wedge compression fracture of fifth lumbar vertebra, subsequent encounter for fracture with routine healing: Secondary | ICD-10-CM | POA: Diagnosis not present

## 2020-06-14 DIAGNOSIS — M810 Age-related osteoporosis without current pathological fracture: Secondary | ICD-10-CM | POA: Diagnosis not present

## 2020-06-14 DIAGNOSIS — F419 Anxiety disorder, unspecified: Secondary | ICD-10-CM | POA: Diagnosis not present

## 2020-06-14 DIAGNOSIS — D62 Acute posthemorrhagic anemia: Secondary | ICD-10-CM | POA: Diagnosis not present

## 2020-06-14 DIAGNOSIS — I340 Nonrheumatic mitral (valve) insufficiency: Secondary | ICD-10-CM | POA: Diagnosis not present

## 2020-06-15 DIAGNOSIS — D62 Acute posthemorrhagic anemia: Secondary | ICD-10-CM | POA: Diagnosis not present

## 2020-06-15 DIAGNOSIS — F419 Anxiety disorder, unspecified: Secondary | ICD-10-CM | POA: Diagnosis not present

## 2020-06-15 DIAGNOSIS — S32050D Wedge compression fracture of fifth lumbar vertebra, subsequent encounter for fracture with routine healing: Secondary | ICD-10-CM | POA: Diagnosis not present

## 2020-06-15 DIAGNOSIS — M25552 Pain in left hip: Secondary | ICD-10-CM | POA: Diagnosis not present

## 2020-06-15 DIAGNOSIS — I340 Nonrheumatic mitral (valve) insufficiency: Secondary | ICD-10-CM | POA: Diagnosis not present

## 2020-06-15 DIAGNOSIS — M810 Age-related osteoporosis without current pathological fracture: Secondary | ICD-10-CM | POA: Diagnosis not present

## 2020-06-21 DIAGNOSIS — S32050D Wedge compression fracture of fifth lumbar vertebra, subsequent encounter for fracture with routine healing: Secondary | ICD-10-CM | POA: Diagnosis not present

## 2020-06-21 DIAGNOSIS — M810 Age-related osteoporosis without current pathological fracture: Secondary | ICD-10-CM | POA: Diagnosis not present

## 2020-06-21 DIAGNOSIS — M25552 Pain in left hip: Secondary | ICD-10-CM | POA: Diagnosis not present

## 2020-06-21 DIAGNOSIS — D62 Acute posthemorrhagic anemia: Secondary | ICD-10-CM | POA: Diagnosis not present

## 2020-06-21 DIAGNOSIS — F419 Anxiety disorder, unspecified: Secondary | ICD-10-CM | POA: Diagnosis not present

## 2020-06-21 DIAGNOSIS — I340 Nonrheumatic mitral (valve) insufficiency: Secondary | ICD-10-CM | POA: Diagnosis not present

## 2020-06-22 DIAGNOSIS — F419 Anxiety disorder, unspecified: Secondary | ICD-10-CM | POA: Diagnosis not present

## 2020-06-22 DIAGNOSIS — M810 Age-related osteoporosis without current pathological fracture: Secondary | ICD-10-CM | POA: Diagnosis not present

## 2020-06-22 DIAGNOSIS — I340 Nonrheumatic mitral (valve) insufficiency: Secondary | ICD-10-CM | POA: Diagnosis not present

## 2020-06-22 DIAGNOSIS — D62 Acute posthemorrhagic anemia: Secondary | ICD-10-CM | POA: Diagnosis not present

## 2020-06-22 DIAGNOSIS — S32050D Wedge compression fracture of fifth lumbar vertebra, subsequent encounter for fracture with routine healing: Secondary | ICD-10-CM | POA: Diagnosis not present

## 2020-06-22 DIAGNOSIS — M25552 Pain in left hip: Secondary | ICD-10-CM | POA: Diagnosis not present

## 2020-06-28 DIAGNOSIS — M25552 Pain in left hip: Secondary | ICD-10-CM | POA: Diagnosis not present

## 2020-06-28 DIAGNOSIS — S32050D Wedge compression fracture of fifth lumbar vertebra, subsequent encounter for fracture with routine healing: Secondary | ICD-10-CM | POA: Diagnosis not present

## 2020-06-28 DIAGNOSIS — M810 Age-related osteoporosis without current pathological fracture: Secondary | ICD-10-CM | POA: Diagnosis not present

## 2020-06-28 DIAGNOSIS — I340 Nonrheumatic mitral (valve) insufficiency: Secondary | ICD-10-CM | POA: Diagnosis not present

## 2020-06-28 DIAGNOSIS — D62 Acute posthemorrhagic anemia: Secondary | ICD-10-CM | POA: Diagnosis not present

## 2020-06-28 DIAGNOSIS — F419 Anxiety disorder, unspecified: Secondary | ICD-10-CM | POA: Diagnosis not present

## 2020-07-05 DIAGNOSIS — F419 Anxiety disorder, unspecified: Secondary | ICD-10-CM | POA: Diagnosis not present

## 2020-07-05 DIAGNOSIS — M25552 Pain in left hip: Secondary | ICD-10-CM | POA: Diagnosis not present

## 2020-07-05 DIAGNOSIS — I340 Nonrheumatic mitral (valve) insufficiency: Secondary | ICD-10-CM | POA: Diagnosis not present

## 2020-07-05 DIAGNOSIS — D62 Acute posthemorrhagic anemia: Secondary | ICD-10-CM | POA: Diagnosis not present

## 2020-07-05 DIAGNOSIS — S32050D Wedge compression fracture of fifth lumbar vertebra, subsequent encounter for fracture with routine healing: Secondary | ICD-10-CM | POA: Diagnosis not present

## 2020-07-05 DIAGNOSIS — M810 Age-related osteoporosis without current pathological fracture: Secondary | ICD-10-CM | POA: Diagnosis not present

## 2020-07-08 DIAGNOSIS — D62 Acute posthemorrhagic anemia: Secondary | ICD-10-CM | POA: Diagnosis not present

## 2020-07-08 DIAGNOSIS — S32050D Wedge compression fracture of fifth lumbar vertebra, subsequent encounter for fracture with routine healing: Secondary | ICD-10-CM | POA: Diagnosis not present

## 2020-07-08 DIAGNOSIS — M810 Age-related osteoporosis without current pathological fracture: Secondary | ICD-10-CM | POA: Diagnosis not present

## 2020-07-08 DIAGNOSIS — F419 Anxiety disorder, unspecified: Secondary | ICD-10-CM | POA: Diagnosis not present

## 2020-07-08 DIAGNOSIS — I340 Nonrheumatic mitral (valve) insufficiency: Secondary | ICD-10-CM | POA: Diagnosis not present

## 2020-07-08 DIAGNOSIS — M25552 Pain in left hip: Secondary | ICD-10-CM | POA: Diagnosis not present

## 2020-09-01 DIAGNOSIS — J069 Acute upper respiratory infection, unspecified: Secondary | ICD-10-CM | POA: Diagnosis not present

## 2020-11-02 DIAGNOSIS — Z0000 Encounter for general adult medical examination without abnormal findings: Secondary | ICD-10-CM | POA: Diagnosis not present

## 2020-11-02 DIAGNOSIS — Z1331 Encounter for screening for depression: Secondary | ICD-10-CM | POA: Diagnosis not present

## 2020-11-02 DIAGNOSIS — Z139 Encounter for screening, unspecified: Secondary | ICD-10-CM | POA: Diagnosis not present

## 2020-11-02 DIAGNOSIS — Z9181 History of falling: Secondary | ICD-10-CM | POA: Diagnosis not present

## 2021-01-05 DIAGNOSIS — R32 Unspecified urinary incontinence: Secondary | ICD-10-CM | POA: Diagnosis not present

## 2021-01-05 DIAGNOSIS — R351 Nocturia: Secondary | ICD-10-CM | POA: Diagnosis not present

## 2021-01-05 DIAGNOSIS — N952 Postmenopausal atrophic vaginitis: Secondary | ICD-10-CM | POA: Diagnosis not present

## 2021-01-05 DIAGNOSIS — N390 Urinary tract infection, site not specified: Secondary | ICD-10-CM | POA: Diagnosis not present

## 2021-01-05 DIAGNOSIS — N8111 Cystocele, midline: Secondary | ICD-10-CM | POA: Diagnosis not present

## 2021-01-05 DIAGNOSIS — Z79899 Other long term (current) drug therapy: Secondary | ICD-10-CM | POA: Diagnosis not present

## 2021-01-05 DIAGNOSIS — R609 Edema, unspecified: Secondary | ICD-10-CM | POA: Diagnosis not present

## 2021-01-21 DIAGNOSIS — Z23 Encounter for immunization: Secondary | ICD-10-CM | POA: Diagnosis not present

## 2021-03-22 DIAGNOSIS — N390 Urinary tract infection, site not specified: Secondary | ICD-10-CM | POA: Diagnosis not present

## 2021-03-22 DIAGNOSIS — N952 Postmenopausal atrophic vaginitis: Secondary | ICD-10-CM | POA: Diagnosis not present

## 2021-03-22 DIAGNOSIS — R351 Nocturia: Secondary | ICD-10-CM | POA: Diagnosis not present

## 2021-03-22 DIAGNOSIS — R32 Unspecified urinary incontinence: Secondary | ICD-10-CM | POA: Diagnosis not present

## 2021-03-22 DIAGNOSIS — R609 Edema, unspecified: Secondary | ICD-10-CM | POA: Diagnosis not present

## 2021-03-22 DIAGNOSIS — N8111 Cystocele, midline: Secondary | ICD-10-CM | POA: Diagnosis not present

## 2021-04-19 DIAGNOSIS — S32050A Wedge compression fracture of fifth lumbar vertebra, initial encounter for closed fracture: Secondary | ICD-10-CM | POA: Diagnosis not present

## 2021-04-19 DIAGNOSIS — E559 Vitamin D deficiency, unspecified: Secondary | ICD-10-CM | POA: Diagnosis not present

## 2021-04-19 DIAGNOSIS — M810 Age-related osteoporosis without current pathological fracture: Secondary | ICD-10-CM | POA: Diagnosis not present

## 2021-04-19 DIAGNOSIS — F419 Anxiety disorder, unspecified: Secondary | ICD-10-CM | POA: Diagnosis not present

## 2021-04-19 DIAGNOSIS — I10 Essential (primary) hypertension: Secondary | ICD-10-CM | POA: Diagnosis not present

## 2021-04-19 DIAGNOSIS — Z79899 Other long term (current) drug therapy: Secondary | ICD-10-CM | POA: Diagnosis not present

## 2021-05-26 DIAGNOSIS — Z961 Presence of intraocular lens: Secondary | ICD-10-CM | POA: Diagnosis not present

## 2021-05-26 DIAGNOSIS — H21261 Iris atrophy (essential) (progressive), right eye: Secondary | ICD-10-CM | POA: Diagnosis not present

## 2021-05-26 DIAGNOSIS — H43393 Other vitreous opacities, bilateral: Secondary | ICD-10-CM | POA: Diagnosis not present

## 2021-05-26 DIAGNOSIS — H20012 Primary iridocyclitis, left eye: Secondary | ICD-10-CM | POA: Diagnosis not present

## 2021-05-26 DIAGNOSIS — D3131 Benign neoplasm of right choroid: Secondary | ICD-10-CM | POA: Diagnosis not present

## 2021-05-31 DIAGNOSIS — Z961 Presence of intraocular lens: Secondary | ICD-10-CM | POA: Diagnosis not present

## 2021-05-31 DIAGNOSIS — H21261 Iris atrophy (essential) (progressive), right eye: Secondary | ICD-10-CM | POA: Diagnosis not present

## 2021-05-31 DIAGNOSIS — H20012 Primary iridocyclitis, left eye: Secondary | ICD-10-CM | POA: Diagnosis not present

## 2021-05-31 DIAGNOSIS — D3131 Benign neoplasm of right choroid: Secondary | ICD-10-CM | POA: Diagnosis not present

## 2021-05-31 DIAGNOSIS — H43393 Other vitreous opacities, bilateral: Secondary | ICD-10-CM | POA: Diagnosis not present

## 2021-07-12 DIAGNOSIS — D3131 Benign neoplasm of right choroid: Secondary | ICD-10-CM | POA: Diagnosis not present

## 2021-07-12 DIAGNOSIS — H21261 Iris atrophy (essential) (progressive), right eye: Secondary | ICD-10-CM | POA: Diagnosis not present

## 2021-07-12 DIAGNOSIS — Z961 Presence of intraocular lens: Secondary | ICD-10-CM | POA: Diagnosis not present

## 2021-07-12 DIAGNOSIS — H20012 Primary iridocyclitis, left eye: Secondary | ICD-10-CM | POA: Diagnosis not present

## 2021-07-12 DIAGNOSIS — H43393 Other vitreous opacities, bilateral: Secondary | ICD-10-CM | POA: Diagnosis not present

## 2021-07-12 DIAGNOSIS — H47012 Ischemic optic neuropathy, left eye: Secondary | ICD-10-CM | POA: Diagnosis not present

## 2021-10-17 DIAGNOSIS — I10 Essential (primary) hypertension: Secondary | ICD-10-CM | POA: Diagnosis not present

## 2021-10-17 DIAGNOSIS — M810 Age-related osteoporosis without current pathological fracture: Secondary | ICD-10-CM | POA: Diagnosis not present

## 2021-10-17 DIAGNOSIS — S32050A Wedge compression fracture of fifth lumbar vertebra, initial encounter for closed fracture: Secondary | ICD-10-CM | POA: Diagnosis not present

## 2021-10-17 DIAGNOSIS — E559 Vitamin D deficiency, unspecified: Secondary | ICD-10-CM | POA: Diagnosis not present

## 2021-10-17 DIAGNOSIS — F419 Anxiety disorder, unspecified: Secondary | ICD-10-CM | POA: Diagnosis not present

## 2021-11-10 DIAGNOSIS — Z1331 Encounter for screening for depression: Secondary | ICD-10-CM | POA: Diagnosis not present

## 2021-11-10 DIAGNOSIS — Z139 Encounter for screening, unspecified: Secondary | ICD-10-CM | POA: Diagnosis not present

## 2021-11-10 DIAGNOSIS — Z0000 Encounter for general adult medical examination without abnormal findings: Secondary | ICD-10-CM | POA: Diagnosis not present

## 2021-11-10 DIAGNOSIS — Z9181 History of falling: Secondary | ICD-10-CM | POA: Diagnosis not present

## 2022-01-16 DIAGNOSIS — H21261 Iris atrophy (essential) (progressive), right eye: Secondary | ICD-10-CM | POA: Diagnosis not present

## 2022-01-16 DIAGNOSIS — H47012 Ischemic optic neuropathy, left eye: Secondary | ICD-10-CM | POA: Diagnosis not present

## 2022-01-16 DIAGNOSIS — D3131 Benign neoplasm of right choroid: Secondary | ICD-10-CM | POA: Diagnosis not present

## 2022-01-16 DIAGNOSIS — Z961 Presence of intraocular lens: Secondary | ICD-10-CM | POA: Diagnosis not present

## 2022-01-16 DIAGNOSIS — H43393 Other vitreous opacities, bilateral: Secondary | ICD-10-CM | POA: Diagnosis not present

## 2022-04-24 DIAGNOSIS — S32050A Wedge compression fracture of fifth lumbar vertebra, initial encounter for closed fracture: Secondary | ICD-10-CM | POA: Diagnosis not present

## 2022-04-24 DIAGNOSIS — M810 Age-related osteoporosis without current pathological fracture: Secondary | ICD-10-CM | POA: Diagnosis not present

## 2022-04-24 DIAGNOSIS — E559 Vitamin D deficiency, unspecified: Secondary | ICD-10-CM | POA: Diagnosis not present

## 2022-04-24 DIAGNOSIS — I10 Essential (primary) hypertension: Secondary | ICD-10-CM | POA: Diagnosis not present

## 2022-04-24 DIAGNOSIS — F419 Anxiety disorder, unspecified: Secondary | ICD-10-CM | POA: Diagnosis not present

## 2022-09-18 ENCOUNTER — Ambulatory Visit (INDEPENDENT_AMBULATORY_CARE_PROVIDER_SITE_OTHER): Payer: Medicare Other | Admitting: Podiatry

## 2022-09-18 DIAGNOSIS — B353 Tinea pedis: Secondary | ICD-10-CM

## 2022-09-18 DIAGNOSIS — L84 Corns and callosities: Secondary | ICD-10-CM | POA: Diagnosis not present

## 2022-09-18 MED ORDER — MICONAZOLE NITRATE 2 % EX POWD: CUTANEOUS | 0 refills | Status: AC | PRN

## 2022-09-18 NOTE — Progress Notes (Signed)
  Subjective:  Patient ID: Melissa Hamilton, female    DOB: 1921-01-04,  MRN: [id number]  Chief Complaint  Patient presents with   Callouses    Callus in between 4th and 5th toe right foot x 1 year. Patient has used OTC athletes foot spray and soaking her feet in epsom salt. Patient is not diabetic.     89 y.o. female presents with the above complaint. History confirmed with patient. Patient presenting with pain related to callus on the right foot between the fourth and fifth toe.  Objective:  Physical Exam: warm, good capillary refill nail exam onychomycosis of the toenails DP pulses palpable, PT pulses palpable, and protective sensation intact Left Foot:  No painful lesions calluses or maceration Right Foot: In the right fourth webspace there is noted to be maceration hyperkeratotic lesion in the webspace and pain on palpation the area.  Redness also consistent with heloma molle / tinea pedis infection interdigitally  Assessment:   1. Heloma molle   2. Tinea pedis of right foot      Plan:  Patient was evaluated and treated and all questions answered.  # Interdigital tinea pedis between the fourth and fifth on the right foot/heloma molle callus -Lightly debrided the callus in between the fourth webspace of the right foot -Apply Betadine daily -Recommend miconazole powder in between the fourth and fifth toe for antifungal properties keep the area very dry   Return in about 4 weeks (around 10/16/2022).         Corinna Gab, DPM [facility] / [facility]

## 2022-10-23 ENCOUNTER — Ambulatory Visit (INDEPENDENT_AMBULATORY_CARE_PROVIDER_SITE_OTHER): Payer: Medicare Other | Admitting: Podiatry

## 2022-10-23 DIAGNOSIS — J3489 Other specified disorders of nose and nasal sinuses: Secondary | ICD-10-CM | POA: Diagnosis not present

## 2022-10-23 DIAGNOSIS — F419 Anxiety disorder, unspecified: Secondary | ICD-10-CM | POA: Diagnosis not present

## 2022-10-23 DIAGNOSIS — L84 Corns and callosities: Secondary | ICD-10-CM | POA: Diagnosis not present

## 2022-10-23 DIAGNOSIS — I10 Essential (primary) hypertension: Secondary | ICD-10-CM | POA: Diagnosis not present

## 2022-10-23 DIAGNOSIS — S32050A Wedge compression fracture of fifth lumbar vertebra, initial encounter for closed fracture: Secondary | ICD-10-CM | POA: Diagnosis not present

## 2022-10-23 DIAGNOSIS — B353 Tinea pedis: Secondary | ICD-10-CM

## 2022-10-23 DIAGNOSIS — M810 Age-related osteoporosis without current pathological fracture: Secondary | ICD-10-CM | POA: Diagnosis not present

## 2022-10-23 DIAGNOSIS — E559 Vitamin D deficiency, unspecified: Secondary | ICD-10-CM | POA: Diagnosis not present

## 2022-10-23 MED ORDER — KETOCONAZOLE 2 % EX CREA: 1.0000 | TOPICAL_CREAM | Freq: Every day | CUTANEOUS | 0 refills | Status: AC

## 2022-10-23 NOTE — Progress Notes (Signed)
  Subjective:  Patient ID: Melissa Hamilton, female    DOB: [birth year],  MRN: [id number]  Chief Complaint  Patient presents with   Follow-up    1 month f/u for Heloma molle. Patient is applying betadine and the powder to area.     89 y.o. female presents with the above complaint. History confirmed with patient. Patient presenting for follow-up of right foot heloma molle in the fourth.  Patient been applying Betadine and statin.  Says it is looking improved dried up.  She does not have much pain when she bumps her toe on something  Objective:  Physical Exam: warm, good capillary refill nail exam onychomycosis of the toenails DP pulses palpable, PT pulses palpable, and protective sensation intact Left Foot:  No painful lesions calluses or maceration Right Foot: In the right fourth webspace there is noted to be dry hyperkeratotic lesion with healthy skin underlying there is no maceration present at this time much improved from prior.  No erythema no evidence of deep wound or infection  Assessment:   1. Heloma molle   2. Tinea pedis of right foot       Plan:  Patient was evaluated and treated and all questions answered.  # Interdigital tinea pedis between the fourth and fifth on the right foot/heloma molle callus -Lightly debrided the callus in between the fourth webspace of the right foot -Apply ketoconazole ointment once daily prescription was sent to the patient's pharmacy. -Recommend moisture control. -Follow-up in 6 weeks for recheck  No follow-ups on file.         Corinna Gab, DPM [facility] / [facility]

## 2022-12-11 ENCOUNTER — Ambulatory Visit (INDEPENDENT_AMBULATORY_CARE_PROVIDER_SITE_OTHER): Payer: Medicare Other | Admitting: Podiatry

## 2022-12-11 DIAGNOSIS — M79675 Pain in left toe(s): Secondary | ICD-10-CM | POA: Diagnosis not present

## 2022-12-11 DIAGNOSIS — B353 Tinea pedis: Secondary | ICD-10-CM | POA: Diagnosis not present

## 2022-12-11 DIAGNOSIS — B351 Tinea unguium: Secondary | ICD-10-CM

## 2022-12-11 DIAGNOSIS — M79674 Pain in right toe(s): Secondary | ICD-10-CM | POA: Diagnosis not present

## 2022-12-11 DIAGNOSIS — L84 Corns and callosities: Secondary | ICD-10-CM | POA: Diagnosis not present

## 2022-12-11 NOTE — Progress Notes (Signed)
  Subjective:  Patient ID: Melissa Hamilton, female    DOB: September 17, 1920,  MRN: [id number]  Chief Complaint  Patient presents with   Follow-up    Heloma molle to right foot. Patient is doing well. She is using the prescribed cream. Denies any pain at this time.    Nail Problem    Nail trim     89 y.o. female presents with the above complaint. History confirmed with patient. Patient presenting for follow-up of right foot heloma molle in the fourth.  Patient been applying Betadine and ketoconazole lotion.  Overall doing much better with regards to the lesion in the right fourth webspace which is pretty much gone away no pain at the area now Also having trouble trimming her nails which are causing pain in her thickened and elongated x 5 bilateral foot  Objective:  Physical Exam: warm, good capillary refill nail exam onychomycosis of the toenails nails are thickened elongated and dystrophic x 5 bilateral foot DP pulses palpable, PT pulses palpable, and protective sensation intact Left Foot:  No painful lesions calluses or maceration Right Foot: In the right fourth webspace there is noted to be healthy skin much improved from prior without maceration or evidence of heloma molle Assessment:   1. Heloma molle   2. Tinea pedis of right foot   3. Pain due to onychomycosis of toenails of both feet        Plan:  Patient was evaluated and treated and all questions answered.  # Interdigital tinea pedis between the fourth and fifth on the right foot/heloma molle callus -Much improved from prior no evidence of hematoma bullae or tinea pedis in the right fourth webspace at this time -Apply ketoconazole ointment 2-3 x weekly on right foot -Recommend moisture control. -Follow-up in 3 mo -I certify that this diagnosis represents a distinct and separate diagnosis that requires evaluation and treatment separate from other procedures or diagnosis  # Pain due to onychomycosis of the nails x 5 bilateral  foot -Nails palliatively debrided as below. -Educated on self-care  Procedure: Nail Debridement Rationale: Pain Type of Debridement: manual, sharp debridement. Instrumentation: Nail nipper, rotary burr. Number of Nails: 10  Return in about 3 months (around 03/12/2023) for RFC.         Corinna Gab, DPM [facility] / [facility]

## 2023-03-12 ENCOUNTER — Ambulatory Visit: Payer: Medicare Other | Admitting: Podiatry

## 2023-03-17 DIAGNOSIS — R059 Cough, unspecified: Secondary | ICD-10-CM | POA: Diagnosis not present

## 2023-03-17 DIAGNOSIS — R0600 Dyspnea, unspecified: Secondary | ICD-10-CM | POA: Diagnosis not present

## 2023-03-17 DIAGNOSIS — R0981 Nasal congestion: Secondary | ICD-10-CM | POA: Diagnosis not present

## 2023-03-19 ENCOUNTER — Ambulatory Visit (INDEPENDENT_AMBULATORY_CARE_PROVIDER_SITE_OTHER): Payer: Medicare Other | Admitting: Podiatry

## 2023-03-19 ENCOUNTER — Encounter: Payer: Self-pay | Admitting: Podiatry

## 2023-03-19 ENCOUNTER — Ambulatory Visit: Payer: Medicare Other | Admitting: Podiatry

## 2023-03-19 DIAGNOSIS — L84 Corns and callosities: Secondary | ICD-10-CM

## 2023-03-19 DIAGNOSIS — B351 Tinea unguium: Secondary | ICD-10-CM | POA: Diagnosis not present

## 2023-03-19 DIAGNOSIS — M79675 Pain in left toe(s): Secondary | ICD-10-CM | POA: Diagnosis not present

## 2023-03-19 DIAGNOSIS — M79674 Pain in right toe(s): Secondary | ICD-10-CM | POA: Diagnosis not present

## 2023-03-21 NOTE — Progress Notes (Signed)
  Subjective:  Patient ID: Melissa Hamilton, female    DOB: Apr 04, 1920,  MRN: [id number]  Chief Complaint  Patient presents with   Foot Pain    Trim nail both feet.  Has sore on RT foot between 4th - 5th toe for some time, continues to use Ketoconzaole ointment.  Hurts at night.      89 y.o. female presents with the above complaint. History confirmed with patient.  Interdigital tinea appears improved from previous.  She has had recurrence of right fourth interspace painful lesion. Also having trouble trimming her nails which are causing pain in her thickened and elongated x 5 bilateral foot.  Unable to trim the nails herself due to dystrophic nature and due to mobility issues.  Objective:  Physical Exam: warm, good capillary refill nail exam onychomycosis of the toenails nails are thickened elongated and dystrophic x 5 bilateral foot DP pulses palpable, PT pulses palpable, and protective sensation intact Left Foot:  No painful lesions calluses or maceration Right Foot: In the right fourth webspace there is noted to painful heloma molle lesion present, no drainage or surrounding erythema. Assessment:   1. Heloma molle   2. Pain due to onychomycosis of toenails of both feet        Plan:  Patient was evaluated and treated and all questions answered.  # Interdigital heloma molle right fourth interspace -Debrided down as a courtesy - Gel toe spacers dispensed - Home care instructions for home management of this was discussed with the patient including white vinegar scrub, gently filing down with emery board, use of moisturization and over-the-counter callus softener.  Continued use of toe spacer. May follow-up as needed for this issue  # Pain due to onychomycosis of the nails x 10 bilateral foot -Nails palliatively debrided as below. -Educated on self-care  Procedure: Nail Debridement Rationale: Pain Type of Debridement: manual, sharp debridement. Instrumentation: Nail nipper,  rotary burr. Number of Nails: 10  Return in about 3 months (around 06/17/2023) for Routine Foot Care.        Bronwen Betters, DPM [facility] / [facility]

## 2023-03-22 DIAGNOSIS — I10 Essential (primary) hypertension: Secondary | ICD-10-CM | POA: Diagnosis not present

## 2023-03-22 DIAGNOSIS — N179 Acute kidney failure, unspecified: Secondary | ICD-10-CM | POA: Diagnosis not present

## 2023-03-22 DIAGNOSIS — I444 Left anterior fascicular block: Secondary | ICD-10-CM | POA: Diagnosis not present

## 2023-03-22 DIAGNOSIS — R2681 Unsteadiness on feet: Secondary | ICD-10-CM | POA: Diagnosis not present

## 2023-03-22 DIAGNOSIS — Z7401 Bed confinement status: Secondary | ICD-10-CM | POA: Diagnosis not present

## 2023-03-22 DIAGNOSIS — S72001D Fracture of unspecified part of neck of right femur, subsequent encounter for closed fracture with routine healing: Secondary | ICD-10-CM | POA: Diagnosis not present

## 2023-03-22 DIAGNOSIS — M6281 Muscle weakness (generalized): Secondary | ICD-10-CM | POA: Diagnosis not present

## 2023-03-22 DIAGNOSIS — W19XXXA Unspecified fall, initial encounter: Secondary | ICD-10-CM | POA: Diagnosis not present

## 2023-03-22 DIAGNOSIS — M25551 Pain in right hip: Secondary | ICD-10-CM | POA: Diagnosis not present

## 2023-03-22 DIAGNOSIS — M810 Age-related osteoporosis without current pathological fracture: Secondary | ICD-10-CM | POA: Diagnosis not present

## 2023-03-22 DIAGNOSIS — S72141A Displaced intertrochanteric fracture of right femur, initial encounter for closed fracture: Secondary | ICD-10-CM | POA: Diagnosis not present

## 2023-03-22 DIAGNOSIS — Z7982 Long term (current) use of aspirin: Secondary | ICD-10-CM | POA: Diagnosis not present

## 2023-03-22 DIAGNOSIS — F0390 Unspecified dementia without behavioral disturbance: Secondary | ICD-10-CM | POA: Diagnosis not present

## 2023-03-22 DIAGNOSIS — S72009A Fracture of unspecified part of neck of unspecified femur, initial encounter for closed fracture: Secondary | ICD-10-CM | POA: Diagnosis not present

## 2023-03-22 DIAGNOSIS — Z79899 Other long term (current) drug therapy: Secondary | ICD-10-CM | POA: Diagnosis not present

## 2023-03-22 DIAGNOSIS — R9431 Abnormal electrocardiogram [ECG] [EKG]: Secondary | ICD-10-CM | POA: Diagnosis not present

## 2023-03-22 DIAGNOSIS — R531 Weakness: Secondary | ICD-10-CM | POA: Diagnosis not present

## 2023-03-22 DIAGNOSIS — K219 Gastro-esophageal reflux disease without esophagitis: Secondary | ICD-10-CM | POA: Diagnosis not present

## 2023-03-22 DIAGNOSIS — S72001A Fracture of unspecified part of neck of right femur, initial encounter for closed fracture: Secondary | ICD-10-CM | POA: Diagnosis not present

## 2023-03-22 DIAGNOSIS — F0394 Unspecified dementia, unspecified severity, with anxiety: Secondary | ICD-10-CM | POA: Diagnosis not present

## 2023-03-22 DIAGNOSIS — I959 Hypotension, unspecified: Secondary | ICD-10-CM | POA: Diagnosis not present

## 2023-03-22 DIAGNOSIS — R2689 Other abnormalities of gait and mobility: Secondary | ICD-10-CM | POA: Diagnosis not present

## 2023-03-22 DIAGNOSIS — D649 Anemia, unspecified: Secondary | ICD-10-CM | POA: Diagnosis present

## 2023-03-22 DIAGNOSIS — I160 Hypertensive urgency: Secondary | ICD-10-CM | POA: Diagnosis not present

## 2023-03-22 DIAGNOSIS — M25572 Pain in left ankle and joints of left foot: Secondary | ICD-10-CM | POA: Diagnosis not present

## 2023-03-22 DIAGNOSIS — W19XXXD Unspecified fall, subsequent encounter: Secondary | ICD-10-CM | POA: Diagnosis not present

## 2023-03-22 DIAGNOSIS — Z4789 Encounter for other orthopedic aftercare: Secondary | ICD-10-CM | POA: Diagnosis not present

## 2023-03-22 DIAGNOSIS — D62 Acute posthemorrhagic anemia: Secondary | ICD-10-CM | POA: Diagnosis not present

## 2023-03-22 DIAGNOSIS — Z01818 Encounter for other preprocedural examination: Secondary | ICD-10-CM | POA: Diagnosis not present

## 2023-03-26 DIAGNOSIS — N1831 Chronic kidney disease, stage 3a: Secondary | ICD-10-CM | POA: Diagnosis not present

## 2023-03-26 DIAGNOSIS — I739 Peripheral vascular disease, unspecified: Secondary | ICD-10-CM | POA: Diagnosis not present

## 2023-03-26 DIAGNOSIS — R262 Difficulty in walking, not elsewhere classified: Secondary | ICD-10-CM | POA: Diagnosis not present

## 2023-03-26 DIAGNOSIS — I160 Hypertensive urgency: Secondary | ICD-10-CM | POA: Diagnosis not present

## 2023-03-26 DIAGNOSIS — M25551 Pain in right hip: Secondary | ICD-10-CM | POA: Diagnosis not present

## 2023-03-26 DIAGNOSIS — R2689 Other abnormalities of gait and mobility: Secondary | ICD-10-CM | POA: Diagnosis not present

## 2023-03-26 DIAGNOSIS — Z4789 Encounter for other orthopedic aftercare: Secondary | ICD-10-CM | POA: Diagnosis not present

## 2023-03-26 DIAGNOSIS — S81801A Unspecified open wound, right lower leg, initial encounter: Secondary | ICD-10-CM | POA: Diagnosis not present

## 2023-03-26 DIAGNOSIS — F0390 Unspecified dementia without behavioral disturbance: Secondary | ICD-10-CM | POA: Diagnosis not present

## 2023-03-26 DIAGNOSIS — R2681 Unsteadiness on feet: Secondary | ICD-10-CM | POA: Diagnosis not present

## 2023-03-26 DIAGNOSIS — I10 Essential (primary) hypertension: Secondary | ICD-10-CM | POA: Diagnosis not present

## 2023-03-26 DIAGNOSIS — S81802A Unspecified open wound, left lower leg, initial encounter: Secondary | ICD-10-CM | POA: Diagnosis not present

## 2023-03-26 DIAGNOSIS — R5381 Other malaise: Secondary | ICD-10-CM | POA: Diagnosis not present

## 2023-03-26 DIAGNOSIS — K219 Gastro-esophageal reflux disease without esophagitis: Secondary | ICD-10-CM | POA: Diagnosis not present

## 2023-03-26 DIAGNOSIS — W19XXXD Unspecified fall, subsequent encounter: Secondary | ICD-10-CM | POA: Diagnosis not present

## 2023-03-26 DIAGNOSIS — R531 Weakness: Secondary | ICD-10-CM | POA: Diagnosis not present

## 2023-03-26 DIAGNOSIS — M6281 Muscle weakness (generalized): Secondary | ICD-10-CM | POA: Diagnosis not present

## 2023-03-26 DIAGNOSIS — Z7401 Bed confinement status: Secondary | ICD-10-CM | POA: Diagnosis not present

## 2023-03-26 DIAGNOSIS — M810 Age-related osteoporosis without current pathological fracture: Secondary | ICD-10-CM | POA: Diagnosis not present

## 2023-03-26 DIAGNOSIS — S72141A Displaced intertrochanteric fracture of right femur, initial encounter for closed fracture: Secondary | ICD-10-CM | POA: Diagnosis not present

## 2023-03-26 DIAGNOSIS — S72001D Fracture of unspecified part of neck of right femur, subsequent encounter for closed fracture with routine healing: Secondary | ICD-10-CM | POA: Diagnosis not present

## 2023-03-26 DIAGNOSIS — I959 Hypotension, unspecified: Secondary | ICD-10-CM | POA: Diagnosis not present

## 2023-03-26 DIAGNOSIS — N179 Acute kidney failure, unspecified: Secondary | ICD-10-CM | POA: Diagnosis not present

## 2023-03-26 DIAGNOSIS — D649 Anemia, unspecified: Secondary | ICD-10-CM | POA: Diagnosis not present

## 2023-03-26 DIAGNOSIS — F0394 Unspecified dementia, unspecified severity, with anxiety: Secondary | ICD-10-CM | POA: Diagnosis not present

## 2023-03-26 DIAGNOSIS — L89620 Pressure ulcer of left heel, unstageable: Secondary | ICD-10-CM | POA: Diagnosis not present

## 2023-03-26 DIAGNOSIS — D62 Acute posthemorrhagic anemia: Secondary | ICD-10-CM | POA: Diagnosis not present

## 2023-03-30 DIAGNOSIS — N1831 Chronic kidney disease, stage 3a: Secondary | ICD-10-CM | POA: Diagnosis not present

## 2023-03-30 DIAGNOSIS — D649 Anemia, unspecified: Secondary | ICD-10-CM | POA: Diagnosis not present

## 2023-03-30 DIAGNOSIS — R262 Difficulty in walking, not elsewhere classified: Secondary | ICD-10-CM | POA: Diagnosis not present

## 2023-03-30 DIAGNOSIS — S72001D Fracture of unspecified part of neck of right femur, subsequent encounter for closed fracture with routine healing: Secondary | ICD-10-CM | POA: Diagnosis not present

## 2023-04-26 DIAGNOSIS — S72141A Displaced intertrochanteric fracture of right femur, initial encounter for closed fracture: Secondary | ICD-10-CM | POA: Diagnosis not present

## 2023-05-02 DIAGNOSIS — S81801A Unspecified open wound, right lower leg, initial encounter: Secondary | ICD-10-CM | POA: Diagnosis not present

## 2023-05-02 DIAGNOSIS — I739 Peripheral vascular disease, unspecified: Secondary | ICD-10-CM | POA: Diagnosis not present

## 2023-05-02 DIAGNOSIS — F0390 Unspecified dementia without behavioral disturbance: Secondary | ICD-10-CM | POA: Diagnosis not present

## 2023-05-02 DIAGNOSIS — L89620 Pressure ulcer of left heel, unstageable: Secondary | ICD-10-CM | POA: Diagnosis not present

## 2023-05-02 DIAGNOSIS — R5381 Other malaise: Secondary | ICD-10-CM | POA: Diagnosis not present

## 2023-05-02 DIAGNOSIS — S81802A Unspecified open wound, left lower leg, initial encounter: Secondary | ICD-10-CM | POA: Diagnosis not present

## 2023-05-02 DIAGNOSIS — M810 Age-related osteoporosis without current pathological fracture: Secondary | ICD-10-CM | POA: Diagnosis not present

## 2023-05-09 DIAGNOSIS — S81801A Unspecified open wound, right lower leg, initial encounter: Secondary | ICD-10-CM | POA: Diagnosis not present

## 2023-05-09 DIAGNOSIS — L89620 Pressure ulcer of left heel, unstageable: Secondary | ICD-10-CM | POA: Diagnosis not present

## 2023-05-09 DIAGNOSIS — S81802A Unspecified open wound, left lower leg, initial encounter: Secondary | ICD-10-CM | POA: Diagnosis not present

## 2023-05-16 DIAGNOSIS — S81802A Unspecified open wound, left lower leg, initial encounter: Secondary | ICD-10-CM | POA: Diagnosis not present

## 2023-05-16 DIAGNOSIS — S81801A Unspecified open wound, right lower leg, initial encounter: Secondary | ICD-10-CM | POA: Diagnosis not present

## 2023-05-16 DIAGNOSIS — L89620 Pressure ulcer of left heel, unstageable: Secondary | ICD-10-CM | POA: Diagnosis not present

## 2023-05-22 DIAGNOSIS — S81801A Unspecified open wound, right lower leg, initial encounter: Secondary | ICD-10-CM | POA: Diagnosis not present

## 2023-05-22 DIAGNOSIS — L89620 Pressure ulcer of left heel, unstageable: Secondary | ICD-10-CM | POA: Diagnosis not present

## 2023-05-30 DIAGNOSIS — L89620 Pressure ulcer of left heel, unstageable: Secondary | ICD-10-CM | POA: Diagnosis not present

## 2023-05-30 DIAGNOSIS — S81802A Unspecified open wound, left lower leg, initial encounter: Secondary | ICD-10-CM | POA: Diagnosis not present

## 2023-06-01 DIAGNOSIS — F0390 Unspecified dementia without behavioral disturbance: Secondary | ICD-10-CM | POA: Diagnosis not present

## 2023-06-01 DIAGNOSIS — E441 Mild protein-calorie malnutrition: Secondary | ICD-10-CM | POA: Diagnosis not present

## 2023-06-01 DIAGNOSIS — I119 Hypertensive heart disease without heart failure: Secondary | ICD-10-CM | POA: Diagnosis not present

## 2023-06-01 DIAGNOSIS — L89629 Pressure ulcer of left heel, unspecified stage: Secondary | ICD-10-CM | POA: Diagnosis not present

## 2023-06-06 DIAGNOSIS — S81801A Unspecified open wound, right lower leg, initial encounter: Secondary | ICD-10-CM | POA: Diagnosis not present

## 2023-06-06 DIAGNOSIS — L89620 Pressure ulcer of left heel, unstageable: Secondary | ICD-10-CM | POA: Diagnosis not present

## 2023-06-11 DIAGNOSIS — S72141A Displaced intertrochanteric fracture of right femur, initial encounter for closed fracture: Secondary | ICD-10-CM | POA: Diagnosis not present

## 2023-06-13 DIAGNOSIS — L89623 Pressure ulcer of left heel, stage 3: Secondary | ICD-10-CM | POA: Diagnosis not present

## 2023-06-13 DIAGNOSIS — S81801A Unspecified open wound, right lower leg, initial encounter: Secondary | ICD-10-CM | POA: Diagnosis not present

## 2023-06-18 ENCOUNTER — Ambulatory Visit: Payer: Medicare Other | Admitting: Podiatry

## 2023-06-20 DIAGNOSIS — L89623 Pressure ulcer of left heel, stage 3: Secondary | ICD-10-CM | POA: Diagnosis not present

## 2023-06-20 DIAGNOSIS — S81801A Unspecified open wound, right lower leg, initial encounter: Secondary | ICD-10-CM | POA: Diagnosis not present

## 2023-06-27 DIAGNOSIS — R52 Pain, unspecified: Secondary | ICD-10-CM | POA: Diagnosis not present

## 2023-06-27 DIAGNOSIS — L89623 Pressure ulcer of left heel, stage 3: Secondary | ICD-10-CM | POA: Diagnosis not present

## 2023-06-27 DIAGNOSIS — S81801A Unspecified open wound, right lower leg, initial encounter: Secondary | ICD-10-CM | POA: Diagnosis not present

## 2023-06-29 DIAGNOSIS — D649 Anemia, unspecified: Secondary | ICD-10-CM | POA: Diagnosis not present

## 2023-06-29 DIAGNOSIS — N1831 Chronic kidney disease, stage 3a: Secondary | ICD-10-CM | POA: Diagnosis not present

## 2023-06-29 DIAGNOSIS — R5381 Other malaise: Secondary | ICD-10-CM | POA: Diagnosis not present

## 2023-06-29 DIAGNOSIS — M2550 Pain in unspecified joint: Secondary | ICD-10-CM | POA: Diagnosis not present

## 2023-07-04 DIAGNOSIS — S81801A Unspecified open wound, right lower leg, initial encounter: Secondary | ICD-10-CM | POA: Diagnosis not present

## 2023-07-04 DIAGNOSIS — L89623 Pressure ulcer of left heel, stage 3: Secondary | ICD-10-CM | POA: Diagnosis not present

## 2023-07-09 DIAGNOSIS — I959 Hypotension, unspecified: Secondary | ICD-10-CM | POA: Diagnosis not present

## 2023-07-09 DIAGNOSIS — L8995 Pressure ulcer of unspecified site, unstageable: Secondary | ICD-10-CM | POA: Diagnosis not present

## 2023-07-09 DIAGNOSIS — Z7401 Bed confinement status: Secondary | ICD-10-CM | POA: Diagnosis not present

## 2023-07-09 DIAGNOSIS — M7061 Trochanteric bursitis, right hip: Secondary | ICD-10-CM | POA: Diagnosis not present

## 2023-07-11 DIAGNOSIS — L89623 Pressure ulcer of left heel, stage 3: Secondary | ICD-10-CM | POA: Diagnosis not present

## 2023-07-11 DIAGNOSIS — S81801A Unspecified open wound, right lower leg, initial encounter: Secondary | ICD-10-CM | POA: Diagnosis not present

## 2023-07-18 DIAGNOSIS — S81801A Unspecified open wound, right lower leg, initial encounter: Secondary | ICD-10-CM | POA: Diagnosis not present

## 2023-07-18 DIAGNOSIS — L89623 Pressure ulcer of left heel, stage 3: Secondary | ICD-10-CM | POA: Diagnosis not present

## 2023-07-25 DIAGNOSIS — S81801A Unspecified open wound, right lower leg, initial encounter: Secondary | ICD-10-CM | POA: Diagnosis not present

## 2023-07-25 DIAGNOSIS — L89623 Pressure ulcer of left heel, stage 3: Secondary | ICD-10-CM | POA: Diagnosis not present

## 2023-07-30 DIAGNOSIS — I1310 Hypertensive heart and chronic kidney disease without heart failure, with stage 1 through stage 4 chronic kidney disease, or unspecified chronic kidney disease: Secondary | ICD-10-CM | POA: Diagnosis not present

## 2023-07-30 DIAGNOSIS — D649 Anemia, unspecified: Secondary | ICD-10-CM | POA: Diagnosis not present

## 2023-07-30 DIAGNOSIS — E441 Mild protein-calorie malnutrition: Secondary | ICD-10-CM | POA: Diagnosis not present

## 2023-07-30 DIAGNOSIS — K219 Gastro-esophageal reflux disease without esophagitis: Secondary | ICD-10-CM | POA: Diagnosis not present

## 2023-08-01 DIAGNOSIS — L89623 Pressure ulcer of left heel, stage 3: Secondary | ICD-10-CM | POA: Diagnosis not present

## 2023-08-01 DIAGNOSIS — S81801A Unspecified open wound, right lower leg, initial encounter: Secondary | ICD-10-CM | POA: Diagnosis not present

## 2023-08-08 DIAGNOSIS — S81801A Unspecified open wound, right lower leg, initial encounter: Secondary | ICD-10-CM | POA: Diagnosis not present

## 2023-08-08 DIAGNOSIS — L89623 Pressure ulcer of left heel, stage 3: Secondary | ICD-10-CM | POA: Diagnosis not present

## 2023-08-15 DIAGNOSIS — L89623 Pressure ulcer of left heel, stage 3: Secondary | ICD-10-CM | POA: Diagnosis not present

## 2023-08-15 DIAGNOSIS — S81801A Unspecified open wound, right lower leg, initial encounter: Secondary | ICD-10-CM | POA: Diagnosis not present

## 2023-08-23 DIAGNOSIS — L89623 Pressure ulcer of left heel, stage 3: Secondary | ICD-10-CM | POA: Diagnosis not present

## 2023-08-23 DIAGNOSIS — S81801A Unspecified open wound, right lower leg, initial encounter: Secondary | ICD-10-CM | POA: Diagnosis not present

## 2023-08-29 DIAGNOSIS — S81801A Unspecified open wound, right lower leg, initial encounter: Secondary | ICD-10-CM | POA: Diagnosis not present

## 2023-08-29 DIAGNOSIS — L89623 Pressure ulcer of left heel, stage 3: Secondary | ICD-10-CM | POA: Diagnosis not present

## 2023-08-30 DIAGNOSIS — M79651 Pain in right thigh: Secondary | ICD-10-CM | POA: Diagnosis not present

## 2023-08-30 DIAGNOSIS — S72141A Displaced intertrochanteric fracture of right femur, initial encounter for closed fracture: Secondary | ICD-10-CM | POA: Diagnosis not present

## 2023-09-03 DIAGNOSIS — E441 Mild protein-calorie malnutrition: Secondary | ICD-10-CM | POA: Diagnosis not present

## 2023-09-03 DIAGNOSIS — R5381 Other malaise: Secondary | ICD-10-CM | POA: Diagnosis not present

## 2023-09-03 DIAGNOSIS — M810 Age-related osteoporosis without current pathological fracture: Secondary | ICD-10-CM | POA: Diagnosis not present

## 2023-09-03 DIAGNOSIS — N1831 Chronic kidney disease, stage 3a: Secondary | ICD-10-CM | POA: Diagnosis not present

## 2023-09-05 DIAGNOSIS — S81802A Unspecified open wound, left lower leg, initial encounter: Secondary | ICD-10-CM | POA: Diagnosis not present

## 2023-09-12 ENCOUNTER — Encounter: Admitting: Vascular Surgery

## 2023-09-12 DIAGNOSIS — S81802A Unspecified open wound, left lower leg, initial encounter: Secondary | ICD-10-CM | POA: Diagnosis not present

## 2023-09-19 DIAGNOSIS — S81802A Unspecified open wound, left lower leg, initial encounter: Secondary | ICD-10-CM | POA: Diagnosis not present

## 2023-09-26 DIAGNOSIS — S81802A Unspecified open wound, left lower leg, initial encounter: Secondary | ICD-10-CM | POA: Diagnosis not present

## 2023-10-01 DIAGNOSIS — F0390 Unspecified dementia without behavioral disturbance: Secondary | ICD-10-CM | POA: Diagnosis not present

## 2023-10-01 DIAGNOSIS — M2550 Pain in unspecified joint: Secondary | ICD-10-CM | POA: Diagnosis not present

## 2023-10-01 DIAGNOSIS — I739 Peripheral vascular disease, unspecified: Secondary | ICD-10-CM | POA: Diagnosis not present

## 2023-10-01 DIAGNOSIS — L89629 Pressure ulcer of left heel, unspecified stage: Secondary | ICD-10-CM | POA: Diagnosis not present

## 2023-10-03 DIAGNOSIS — S81802A Unspecified open wound, left lower leg, initial encounter: Secondary | ICD-10-CM | POA: Diagnosis not present

## 2023-10-10 DIAGNOSIS — S81802A Unspecified open wound, left lower leg, initial encounter: Secondary | ICD-10-CM | POA: Diagnosis not present

## 2023-10-17 DIAGNOSIS — S81802A Unspecified open wound, left lower leg, initial encounter: Secondary | ICD-10-CM | POA: Diagnosis not present

## 2023-10-24 DIAGNOSIS — S81802A Unspecified open wound, left lower leg, initial encounter: Secondary | ICD-10-CM | POA: Diagnosis not present

## 2023-10-30 DIAGNOSIS — E039 Hypothyroidism, unspecified: Secondary | ICD-10-CM | POA: Diagnosis not present

## 2023-10-30 DIAGNOSIS — E441 Mild protein-calorie malnutrition: Secondary | ICD-10-CM | POA: Diagnosis not present

## 2023-10-30 DIAGNOSIS — K219 Gastro-esophageal reflux disease without esophagitis: Secondary | ICD-10-CM | POA: Diagnosis not present

## 2023-10-30 DIAGNOSIS — D649 Anemia, unspecified: Secondary | ICD-10-CM | POA: Diagnosis not present

## 2023-10-31 DIAGNOSIS — S81802A Unspecified open wound, left lower leg, initial encounter: Secondary | ICD-10-CM | POA: Diagnosis not present

## 2023-11-07 DIAGNOSIS — S81802A Unspecified open wound, left lower leg, initial encounter: Secondary | ICD-10-CM | POA: Diagnosis not present

## 2023-11-14 DIAGNOSIS — S81802A Unspecified open wound, left lower leg, initial encounter: Secondary | ICD-10-CM | POA: Diagnosis not present

## 2023-11-19 DIAGNOSIS — I509 Heart failure, unspecified: Secondary | ICD-10-CM | POA: Diagnosis not present

## 2023-11-19 DIAGNOSIS — E1122 Type 2 diabetes mellitus with diabetic chronic kidney disease: Secondary | ICD-10-CM | POA: Diagnosis not present

## 2023-11-28 DIAGNOSIS — S81802A Unspecified open wound, left lower leg, initial encounter: Secondary | ICD-10-CM | POA: Diagnosis not present

## 2023-11-29 DIAGNOSIS — E441 Mild protein-calorie malnutrition: Secondary | ICD-10-CM | POA: Diagnosis not present

## 2023-11-29 DIAGNOSIS — N1831 Chronic kidney disease, stage 3a: Secondary | ICD-10-CM | POA: Diagnosis not present

## 2023-11-29 DIAGNOSIS — I119 Hypertensive heart disease without heart failure: Secondary | ICD-10-CM | POA: Diagnosis not present

## 2023-11-29 DIAGNOSIS — M810 Age-related osteoporosis without current pathological fracture: Secondary | ICD-10-CM | POA: Diagnosis not present

## 2023-12-05 DIAGNOSIS — S81802A Unspecified open wound, left lower leg, initial encounter: Secondary | ICD-10-CM | POA: Diagnosis not present

## 2023-12-09 DIAGNOSIS — L97929 Non-pressure chronic ulcer of unspecified part of left lower leg with unspecified severity: Secondary | ICD-10-CM | POA: Diagnosis not present

## 2023-12-09 DIAGNOSIS — R5381 Other malaise: Secondary | ICD-10-CM | POA: Diagnosis not present

## 2023-12-09 DIAGNOSIS — I739 Peripheral vascular disease, unspecified: Secondary | ICD-10-CM | POA: Diagnosis not present

## 2023-12-09 DIAGNOSIS — M2550 Pain in unspecified joint: Secondary | ICD-10-CM | POA: Diagnosis not present

## 2023-12-12 DIAGNOSIS — S81802A Unspecified open wound, left lower leg, initial encounter: Secondary | ICD-10-CM | POA: Diagnosis not present

## 2023-12-19 DIAGNOSIS — S81802A Unspecified open wound, left lower leg, initial encounter: Secondary | ICD-10-CM | POA: Diagnosis not present

## 2023-12-27 DIAGNOSIS — S81802A Unspecified open wound, left lower leg, initial encounter: Secondary | ICD-10-CM | POA: Diagnosis not present

## 2024-01-02 DIAGNOSIS — S81802A Unspecified open wound, left lower leg, initial encounter: Secondary | ICD-10-CM | POA: Diagnosis not present

## 2024-01-09 DIAGNOSIS — S81802D Unspecified open wound, left lower leg, subsequent encounter: Secondary | ICD-10-CM | POA: Diagnosis not present

## 2024-01-16 DIAGNOSIS — S81802D Unspecified open wound, left lower leg, subsequent encounter: Secondary | ICD-10-CM | POA: Diagnosis not present

## 2024-01-28 DIAGNOSIS — D649 Anemia, unspecified: Secondary | ICD-10-CM | POA: Diagnosis not present

## 2024-01-28 DIAGNOSIS — K219 Gastro-esophageal reflux disease without esophagitis: Secondary | ICD-10-CM | POA: Diagnosis not present

## 2024-01-28 DIAGNOSIS — F0390 Unspecified dementia without behavioral disturbance: Secondary | ICD-10-CM | POA: Diagnosis not present

## 2024-01-28 DIAGNOSIS — E039 Hypothyroidism, unspecified: Secondary | ICD-10-CM | POA: Diagnosis not present

## 2024-01-30 DIAGNOSIS — S81802D Unspecified open wound, left lower leg, subsequent encounter: Secondary | ICD-10-CM | POA: Diagnosis not present

## 2024-02-06 DIAGNOSIS — S81802D Unspecified open wound, left lower leg, subsequent encounter: Secondary | ICD-10-CM | POA: Diagnosis not present

## 2024-02-13 DIAGNOSIS — S81802D Unspecified open wound, left lower leg, subsequent encounter: Secondary | ICD-10-CM | POA: Diagnosis not present

## 2024-02-22 DIAGNOSIS — L602 Onychogryphosis: Secondary | ICD-10-CM | POA: Diagnosis not present

## 2024-02-22 DIAGNOSIS — L603 Nail dystrophy: Secondary | ICD-10-CM | POA: Diagnosis not present

## 2024-02-22 DIAGNOSIS — L97222 Non-pressure chronic ulcer of left calf with fat layer exposed: Secondary | ICD-10-CM | POA: Diagnosis not present

## 2024-02-22 DIAGNOSIS — I739 Peripheral vascular disease, unspecified: Secondary | ICD-10-CM | POA: Diagnosis not present

## 2024-02-27 DIAGNOSIS — S81802D Unspecified open wound, left lower leg, subsequent encounter: Secondary | ICD-10-CM | POA: Diagnosis not present
